# Patient Record
Sex: Female | Born: 1945 | Race: White | Hispanic: No | Marital: Married | Smoking: Never smoker
Health system: Southern US, Community
[De-identification: ages and names within clinical notes are randomized; demographics above are authoritative.]

## PROBLEM LIST (undated history)

## (undated) DIAGNOSIS — Z923 Personal history of irradiation: Secondary | ICD-10-CM

## (undated) DIAGNOSIS — T8859XA Other complications of anesthesia, initial encounter: Secondary | ICD-10-CM

## (undated) DIAGNOSIS — H269 Unspecified cataract: Secondary | ICD-10-CM

## (undated) DIAGNOSIS — T7840XA Allergy, unspecified, initial encounter: Secondary | ICD-10-CM

## (undated) DIAGNOSIS — M810 Age-related osteoporosis without current pathological fracture: Secondary | ICD-10-CM

## (undated) DIAGNOSIS — K219 Gastro-esophageal reflux disease without esophagitis: Secondary | ICD-10-CM

## (undated) DIAGNOSIS — M1990 Unspecified osteoarthritis, unspecified site: Secondary | ICD-10-CM

## (undated) DIAGNOSIS — C50919 Malignant neoplasm of unspecified site of unspecified female breast: Secondary | ICD-10-CM

## (undated) HISTORY — DX: Unspecified osteoarthritis, unspecified site: M19.90

## (undated) HISTORY — DX: Gastro-esophageal reflux disease without esophagitis: K21.9

## (undated) HISTORY — DX: Allergy, unspecified, initial encounter: T78.40XA

## (undated) HISTORY — PX: CATARACT EXTRACTION W/ INTRAOCULAR LENS  IMPLANT, BILATERAL: SHX1307

## (undated) HISTORY — PX: TUBAL LIGATION: SHX77

## (undated) HISTORY — DX: Unspecified cataract: H26.9

## (undated) HISTORY — DX: Age-related osteoporosis without current pathological fracture: M81.0

## (undated) HISTORY — PX: COLONOSCOPY: SHX174

---

## 2004-10-13 ENCOUNTER — Ambulatory Visit: Payer: Self-pay | Admitting: Internal Medicine

## 2005-08-22 ENCOUNTER — Ambulatory Visit: Payer: Self-pay | Admitting: Internal Medicine

## 2006-08-31 ENCOUNTER — Ambulatory Visit: Payer: Self-pay | Admitting: Internal Medicine

## 2006-10-04 ENCOUNTER — Ambulatory Visit (HOSPITAL_COMMUNITY): Admission: RE | Admit: 2006-10-04 | Discharge: 2006-10-04 | Payer: Self-pay | Admitting: *Deleted

## 2007-10-31 ENCOUNTER — Ambulatory Visit: Payer: Self-pay | Admitting: Internal Medicine

## 2008-11-05 ENCOUNTER — Ambulatory Visit: Payer: Self-pay | Admitting: Internal Medicine

## 2008-11-18 ENCOUNTER — Ambulatory Visit: Payer: Self-pay | Admitting: Internal Medicine

## 2009-11-23 ENCOUNTER — Ambulatory Visit: Payer: Self-pay | Admitting: Internal Medicine

## 2010-01-13 ENCOUNTER — Ambulatory Visit: Payer: Self-pay | Admitting: Internal Medicine

## 2011-01-12 ENCOUNTER — Ambulatory Visit: Payer: Self-pay | Admitting: Internal Medicine

## 2012-01-18 ENCOUNTER — Ambulatory Visit: Payer: Self-pay | Admitting: Internal Medicine

## 2013-02-06 ENCOUNTER — Ambulatory Visit: Payer: Self-pay | Admitting: Internal Medicine

## 2013-02-12 ENCOUNTER — Ambulatory Visit: Payer: Self-pay | Admitting: Internal Medicine

## 2014-02-12 ENCOUNTER — Ambulatory Visit: Payer: Self-pay | Admitting: Internal Medicine

## 2015-02-16 ENCOUNTER — Ambulatory Visit: Payer: Self-pay | Admitting: Internal Medicine

## 2015-02-16 DIAGNOSIS — Z13220 Encounter for screening for lipoid disorders: Secondary | ICD-10-CM | POA: Diagnosis not present

## 2015-02-16 DIAGNOSIS — Z1389 Encounter for screening for other disorder: Secondary | ICD-10-CM | POA: Diagnosis not present

## 2015-02-16 DIAGNOSIS — R7989 Other specified abnormal findings of blood chemistry: Secondary | ICD-10-CM | POA: Diagnosis not present

## 2015-02-16 DIAGNOSIS — Z79899 Other long term (current) drug therapy: Secondary | ICD-10-CM | POA: Diagnosis not present

## 2015-02-16 DIAGNOSIS — Z1231 Encounter for screening mammogram for malignant neoplasm of breast: Secondary | ICD-10-CM | POA: Diagnosis not present

## 2015-02-23 DIAGNOSIS — J309 Allergic rhinitis, unspecified: Secondary | ICD-10-CM | POA: Diagnosis not present

## 2015-02-23 DIAGNOSIS — Z0000 Encounter for general adult medical examination without abnormal findings: Secondary | ICD-10-CM | POA: Diagnosis not present

## 2015-02-23 DIAGNOSIS — H9203 Otalgia, bilateral: Secondary | ICD-10-CM | POA: Diagnosis not present

## 2015-02-23 DIAGNOSIS — G8929 Other chronic pain: Secondary | ICD-10-CM | POA: Diagnosis not present

## 2015-06-30 DIAGNOSIS — M25511 Pain in right shoulder: Secondary | ICD-10-CM | POA: Diagnosis not present

## 2015-06-30 DIAGNOSIS — M19011 Primary osteoarthritis, right shoulder: Secondary | ICD-10-CM | POA: Diagnosis not present

## 2015-08-25 DIAGNOSIS — L989 Disorder of the skin and subcutaneous tissue, unspecified: Secondary | ICD-10-CM | POA: Diagnosis not present

## 2015-08-25 DIAGNOSIS — I10 Essential (primary) hypertension: Secondary | ICD-10-CM | POA: Diagnosis not present

## 2015-08-25 DIAGNOSIS — R7989 Other specified abnormal findings of blood chemistry: Secondary | ICD-10-CM | POA: Diagnosis not present

## 2015-08-25 DIAGNOSIS — Z79899 Other long term (current) drug therapy: Secondary | ICD-10-CM | POA: Diagnosis not present

## 2015-08-25 DIAGNOSIS — E7800 Pure hypercholesterolemia, unspecified: Secondary | ICD-10-CM | POA: Diagnosis not present

## 2015-08-25 DIAGNOSIS — M25511 Pain in right shoulder: Secondary | ICD-10-CM | POA: Diagnosis not present

## 2015-09-10 DIAGNOSIS — M7581 Other shoulder lesions, right shoulder: Secondary | ICD-10-CM | POA: Diagnosis not present

## 2015-09-22 DIAGNOSIS — M7581 Other shoulder lesions, right shoulder: Secondary | ICD-10-CM | POA: Diagnosis not present

## 2015-09-22 DIAGNOSIS — M25511 Pain in right shoulder: Secondary | ICD-10-CM | POA: Diagnosis not present

## 2015-09-25 DIAGNOSIS — M7581 Other shoulder lesions, right shoulder: Secondary | ICD-10-CM | POA: Diagnosis not present

## 2015-09-25 DIAGNOSIS — M25511 Pain in right shoulder: Secondary | ICD-10-CM | POA: Diagnosis not present

## 2015-09-28 DIAGNOSIS — M25511 Pain in right shoulder: Secondary | ICD-10-CM | POA: Diagnosis not present

## 2015-09-28 DIAGNOSIS — M7581 Other shoulder lesions, right shoulder: Secondary | ICD-10-CM | POA: Diagnosis not present

## 2015-10-06 DIAGNOSIS — M25511 Pain in right shoulder: Secondary | ICD-10-CM | POA: Diagnosis not present

## 2015-10-06 DIAGNOSIS — M7581 Other shoulder lesions, right shoulder: Secondary | ICD-10-CM | POA: Diagnosis not present

## 2015-10-09 DIAGNOSIS — M25511 Pain in right shoulder: Secondary | ICD-10-CM | POA: Diagnosis not present

## 2015-10-09 DIAGNOSIS — M7581 Other shoulder lesions, right shoulder: Secondary | ICD-10-CM | POA: Diagnosis not present

## 2015-10-12 DIAGNOSIS — M25511 Pain in right shoulder: Secondary | ICD-10-CM | POA: Diagnosis not present

## 2015-10-12 DIAGNOSIS — M7581 Other shoulder lesions, right shoulder: Secondary | ICD-10-CM | POA: Diagnosis not present

## 2015-10-20 DIAGNOSIS — M7581 Other shoulder lesions, right shoulder: Secondary | ICD-10-CM | POA: Diagnosis not present

## 2015-10-20 DIAGNOSIS — M25511 Pain in right shoulder: Secondary | ICD-10-CM | POA: Diagnosis not present

## 2015-10-22 DIAGNOSIS — M7581 Other shoulder lesions, right shoulder: Secondary | ICD-10-CM | POA: Diagnosis not present

## 2015-10-22 DIAGNOSIS — M25511 Pain in right shoulder: Secondary | ICD-10-CM | POA: Diagnosis not present

## 2015-10-26 DIAGNOSIS — L578 Other skin changes due to chronic exposure to nonionizing radiation: Secondary | ICD-10-CM | POA: Diagnosis not present

## 2015-10-26 DIAGNOSIS — L821 Other seborrheic keratosis: Secondary | ICD-10-CM | POA: Diagnosis not present

## 2015-10-26 DIAGNOSIS — L820 Inflamed seborrheic keratosis: Secondary | ICD-10-CM | POA: Diagnosis not present

## 2016-02-23 ENCOUNTER — Other Ambulatory Visit: Payer: Self-pay | Admitting: Internal Medicine

## 2016-02-23 DIAGNOSIS — Z1231 Encounter for screening mammogram for malignant neoplasm of breast: Secondary | ICD-10-CM

## 2016-02-24 ENCOUNTER — Other Ambulatory Visit: Payer: Self-pay | Admitting: Internal Medicine

## 2016-02-24 ENCOUNTER — Ambulatory Visit
Admission: RE | Admit: 2016-02-24 | Discharge: 2016-02-24 | Disposition: A | Discharge status: Home / Self Care | Payer: Commercial Managed Care - HMO | Source: Ambulatory Visit | Attending: Internal Medicine | Admitting: Internal Medicine

## 2016-02-24 DIAGNOSIS — Z13220 Encounter for screening for lipoid disorders: Secondary | ICD-10-CM | POA: Diagnosis not present

## 2016-02-24 DIAGNOSIS — Z1211 Encounter for screening for malignant neoplasm of colon: Secondary | ICD-10-CM | POA: Diagnosis not present

## 2016-02-24 DIAGNOSIS — Z0000 Encounter for general adult medical examination without abnormal findings: Secondary | ICD-10-CM | POA: Diagnosis not present

## 2016-02-24 DIAGNOSIS — M8580 Other specified disorders of bone density and structure, unspecified site: Secondary | ICD-10-CM | POA: Diagnosis not present

## 2016-02-24 DIAGNOSIS — Z1231 Encounter for screening mammogram for malignant neoplasm of breast: Secondary | ICD-10-CM | POA: Insufficient documentation

## 2016-02-24 DIAGNOSIS — Z79899 Other long term (current) drug therapy: Secondary | ICD-10-CM | POA: Diagnosis not present

## 2016-02-24 DIAGNOSIS — I10 Essential (primary) hypertension: Secondary | ICD-10-CM | POA: Diagnosis not present

## 2016-02-24 DIAGNOSIS — R7989 Other specified abnormal findings of blood chemistry: Secondary | ICD-10-CM | POA: Diagnosis not present

## 2016-02-24 DIAGNOSIS — J301 Allergic rhinitis due to pollen: Secondary | ICD-10-CM | POA: Diagnosis not present

## 2016-03-03 DIAGNOSIS — Z1211 Encounter for screening for malignant neoplasm of colon: Secondary | ICD-10-CM | POA: Diagnosis not present

## 2016-03-14 DIAGNOSIS — M8580 Other specified disorders of bone density and structure, unspecified site: Secondary | ICD-10-CM | POA: Diagnosis not present

## 2016-07-05 ENCOUNTER — Encounter: Payer: Self-pay | Admitting: Internal Medicine

## 2016-09-01 DIAGNOSIS — R946 Abnormal results of thyroid function studies: Secondary | ICD-10-CM | POA: Diagnosis not present

## 2016-09-01 DIAGNOSIS — I10 Essential (primary) hypertension: Secondary | ICD-10-CM | POA: Diagnosis not present

## 2016-09-01 DIAGNOSIS — J301 Allergic rhinitis due to pollen: Secondary | ICD-10-CM | POA: Diagnosis not present

## 2016-09-01 DIAGNOSIS — E7800 Pure hypercholesterolemia, unspecified: Secondary | ICD-10-CM | POA: Diagnosis not present

## 2016-09-01 DIAGNOSIS — Z79899 Other long term (current) drug therapy: Secondary | ICD-10-CM | POA: Diagnosis not present

## 2017-02-06 DIAGNOSIS — H524 Presbyopia: Secondary | ICD-10-CM | POA: Diagnosis not present

## 2017-02-06 DIAGNOSIS — Z0100 Encounter for examination of eyes and vision without abnormal findings: Secondary | ICD-10-CM | POA: Diagnosis not present

## 2017-02-21 DIAGNOSIS — I10 Essential (primary) hypertension: Secondary | ICD-10-CM | POA: Diagnosis not present

## 2017-02-21 DIAGNOSIS — R946 Abnormal results of thyroid function studies: Secondary | ICD-10-CM | POA: Diagnosis not present

## 2017-02-21 DIAGNOSIS — E7800 Pure hypercholesterolemia, unspecified: Secondary | ICD-10-CM | POA: Diagnosis not present

## 2017-02-21 DIAGNOSIS — Z79899 Other long term (current) drug therapy: Secondary | ICD-10-CM | POA: Diagnosis not present

## 2017-02-27 DIAGNOSIS — M19071 Primary osteoarthritis, right ankle and foot: Secondary | ICD-10-CM | POA: Diagnosis not present

## 2017-02-27 DIAGNOSIS — I10 Essential (primary) hypertension: Secondary | ICD-10-CM | POA: Diagnosis not present

## 2017-02-27 DIAGNOSIS — Z1231 Encounter for screening mammogram for malignant neoplasm of breast: Secondary | ICD-10-CM | POA: Diagnosis not present

## 2017-02-27 DIAGNOSIS — Z0000 Encounter for general adult medical examination without abnormal findings: Secondary | ICD-10-CM | POA: Diagnosis not present

## 2017-02-27 DIAGNOSIS — M79671 Pain in right foot: Secondary | ICD-10-CM | POA: Diagnosis not present

## 2017-03-06 ENCOUNTER — Other Ambulatory Visit: Payer: Self-pay | Admitting: Internal Medicine

## 2017-03-06 DIAGNOSIS — Z1231 Encounter for screening mammogram for malignant neoplasm of breast: Secondary | ICD-10-CM

## 2017-03-28 ENCOUNTER — Ambulatory Visit
Admission: RE | Admit: 2017-03-28 | Discharge: 2017-03-28 | Disposition: A | Discharge status: Home / Self Care | Payer: Commercial Managed Care - HMO | Source: Ambulatory Visit | Attending: Internal Medicine | Admitting: Internal Medicine

## 2017-03-28 ENCOUNTER — Encounter: Payer: Self-pay | Admitting: Radiology

## 2017-03-28 DIAGNOSIS — Z1231 Encounter for screening mammogram for malignant neoplasm of breast: Secondary | ICD-10-CM | POA: Diagnosis not present

## 2017-08-29 DIAGNOSIS — J301 Allergic rhinitis due to pollen: Secondary | ICD-10-CM | POA: Diagnosis not present

## 2017-08-29 DIAGNOSIS — E7800 Pure hypercholesterolemia, unspecified: Secondary | ICD-10-CM | POA: Diagnosis not present

## 2017-08-29 DIAGNOSIS — Z1211 Encounter for screening for malignant neoplasm of colon: Secondary | ICD-10-CM | POA: Diagnosis not present

## 2017-08-29 DIAGNOSIS — I10 Essential (primary) hypertension: Secondary | ICD-10-CM | POA: Diagnosis not present

## 2017-08-29 DIAGNOSIS — Z79899 Other long term (current) drug therapy: Secondary | ICD-10-CM | POA: Diagnosis not present

## 2017-08-29 DIAGNOSIS — J329 Chronic sinusitis, unspecified: Secondary | ICD-10-CM | POA: Diagnosis not present

## 2017-09-06 DIAGNOSIS — Z1211 Encounter for screening for malignant neoplasm of colon: Secondary | ICD-10-CM | POA: Diagnosis not present

## 2017-10-02 ENCOUNTER — Other Ambulatory Visit: Payer: Self-pay

## 2017-10-02 ENCOUNTER — Emergency Department: Payer: Medicare HMO

## 2017-10-02 ENCOUNTER — Observation Stay
Admission: EM | Admit: 2017-10-02 | Discharge: 2017-10-03 | Disposition: A | Discharge status: Home / Self Care | Payer: Medicare HMO | Attending: Internal Medicine | Admitting: Internal Medicine

## 2017-10-02 DIAGNOSIS — R0789 Other chest pain: Secondary | ICD-10-CM | POA: Diagnosis not present

## 2017-10-02 DIAGNOSIS — Y9389 Activity, other specified: Secondary | ICD-10-CM | POA: Diagnosis not present

## 2017-10-02 DIAGNOSIS — Y9289 Other specified places as the place of occurrence of the external cause: Secondary | ICD-10-CM | POA: Insufficient documentation

## 2017-10-02 DIAGNOSIS — Z79899 Other long term (current) drug therapy: Secondary | ICD-10-CM | POA: Diagnosis not present

## 2017-10-02 DIAGNOSIS — T50905A Adverse effect of unspecified drugs, medicaments and biological substances, initial encounter: Secondary | ICD-10-CM | POA: Diagnosis not present

## 2017-10-02 DIAGNOSIS — I10 Essential (primary) hypertension: Secondary | ICD-10-CM | POA: Insufficient documentation

## 2017-10-02 DIAGNOSIS — R079 Chest pain, unspecified: Secondary | ICD-10-CM

## 2017-10-02 DIAGNOSIS — X58XXXA Exposure to other specified factors, initial encounter: Secondary | ICD-10-CM | POA: Insufficient documentation

## 2017-10-02 DIAGNOSIS — Z8249 Family history of ischemic heart disease and other diseases of the circulatory system: Secondary | ICD-10-CM | POA: Insufficient documentation

## 2017-10-02 LAB — CBC
HEMATOCRIT: 42.4 % (ref 35.0–47.0)
Hemoglobin: 14.3 g/dL (ref 12.0–16.0)
MCH: 28.9 pg (ref 26.0–34.0)
MCHC: 33.7 g/dL (ref 32.0–36.0)
MCV: 85.7 fL (ref 80.0–100.0)
PLATELETS: 155 10*3/uL (ref 150–440)
RBC: 4.95 MIL/uL (ref 3.80–5.20)
RDW: 13.5 % (ref 11.5–14.5)
WBC: 4.7 10*3/uL (ref 3.6–11.0)

## 2017-10-02 LAB — BASIC METABOLIC PANEL
Anion gap: 10 (ref 5–15)
BUN: 13 mg/dL (ref 6–20)
CHLORIDE: 106 mmol/L (ref 101–111)
CO2: 25 mmol/L (ref 22–32)
CREATININE: 0.7 mg/dL (ref 0.44–1.00)
Calcium: 9.7 mg/dL (ref 8.9–10.3)
GFR calc non Af Amer: >60 mL/min (ref >60)
Glucose, Bld: 109 mg/dL — ABNORMAL HIGH (ref 65–99)
POTASSIUM: 4.1 mmol/L (ref 3.5–5.1)
SODIUM: 141 mmol/L (ref 135–145)

## 2017-10-02 LAB — TROPONIN I
TROPONIN I: 0.08 ng/mL — AB (ref <0.03)
Troponin I: 0.03 ng/mL (ref <0.03)
Troponin I: 0.04 ng/mL (ref <0.03)

## 2017-10-02 MED ORDER — ACETAMINOPHEN 325 MG PO TABS
650.0000 mg | ORAL_TABLET | Freq: Four times a day (QID) | ORAL | Status: DC | PRN
  Administered 2017-10-02 – 2017-10-03 (×3): 650 mg via ORAL
  Filled 2017-10-02 (×3): qty 2

## 2017-10-02 MED ORDER — ONDANSETRON HCL 4 MG/2ML IJ SOLN: 4.0000 mg | Freq: Four times a day (QID) | INTRAMUSCULAR | Status: DC | PRN

## 2017-10-02 MED ORDER — BISACODYL 5 MG PO TBEC: 5.0000 mg | DELAYED_RELEASE_TABLET | Freq: Every day | ORAL | Status: DC | PRN

## 2017-10-02 MED ORDER — ACETAMINOPHEN 650 MG RE SUPP: 650.0000 mg | Freq: Four times a day (QID) | RECTAL | Status: DC | PRN

## 2017-10-02 MED ORDER — DOCUSATE SODIUM 100 MG PO CAPS
100.0000 mg | ORAL_CAPSULE | Freq: Two times a day (BID) | ORAL | Status: DC
  Administered 2017-10-03: 100 mg via ORAL
  Filled 2017-10-02: qty 1

## 2017-10-02 MED ORDER — METOPROLOL TARTRATE 25 MG PO TABS
25.0000 mg | ORAL_TABLET | Freq: Two times a day (BID) | ORAL | Status: DC
  Administered 2017-10-02: 25 mg via ORAL
  Filled 2017-10-02 (×3): qty 1

## 2017-10-02 MED ORDER — ENOXAPARIN SODIUM 40 MG/0.4ML ~~LOC~~ SOLN: 40.0000 mg | SUBCUTANEOUS | Status: DC

## 2017-10-02 MED ORDER — TRAZODONE HCL 50 MG PO TABS: 25.0000 mg | ORAL_TABLET | Freq: Every evening | ORAL | Status: DC | PRN

## 2017-10-02 MED ORDER — SODIUM CHLORIDE 0.9% FLUSH
3.0000 mL | Freq: Two times a day (BID) | INTRAVENOUS | Status: DC
  Administered 2017-10-02 – 2017-10-03 (×2): 3 mL via INTRAVENOUS

## 2017-10-02 MED ORDER — ONDANSETRON HCL 4 MG PO TABS: 4.0000 mg | ORAL_TABLET | Freq: Four times a day (QID) | ORAL | Status: DC | PRN

## 2017-10-02 MED ORDER — ASPIRIN EC 81 MG PO TBEC
81.0000 mg | DELAYED_RELEASE_TABLET | Freq: Every day | ORAL | Status: DC
  Administered 2017-10-02 – 2017-10-03 (×2): 81 mg via ORAL
  Filled 2017-10-02 (×2): qty 1

## 2017-10-02 MED ORDER — NITROGLYCERIN 2 % TD OINT
0.5000 [in_us] | TOPICAL_OINTMENT | Freq: Four times a day (QID) | TRANSDERMAL | Status: DC
  Administered 2017-10-02 – 2017-10-03 (×4): 0.5 [in_us] via TOPICAL
  Filled 2017-10-02 (×4): qty 1

## 2017-10-02 NOTE — ED Provider Notes (Signed)
[facility] Emergency Department Provider Note  ____________________________________________   First MD Initiated Contact with Patient 10/02/17 1100     (approximate)  I have reviewed the triage vital signs and the nursing notes.   HISTORY  Chief Complaint Chest Pain   HPI Jean Schwartz is a 71 y.o. female that any chronic medical problems was presented to the emergency department with central chest pain.  She says that she was getting numbed for a procedure at her dentist's office when she began to feel chest pressure to the center left of her chest.  She denies any radiation of the pressure.  Denies any shortness of breath, nausea or vomiting.  However, when EMS arrived they gave her for aspirin as well as 1 nitroglycerin spray which completely resolved the pain.  She reports also one episode last week where she thought that she was breathing heavily through her mouth outside when she also experienced a brief episode of chest pain.  She does have a history of coronary artery disease in her family with her father having heart attack in his 84s.  The patient denies smoking.  Denies taking any medications on a regular basis.    No past medical history on file.  There are no active problems to display for this patient.   No past surgical history on file.  Prior to Admission medications   Not on File    Allergies Patient has no allergy information on record.  Family History  Problem Relation Age of Onset  . Breast cancer Neg Hx     Social History Social History   Tobacco Use  . Smoking status: Not on file  Substance Use Topics  . Alcohol use: Not on file  . Drug use: Not on file    Review of Systems  Constitutional: No fever/chills Eyes: No visual changes. ENT: No sore throat. Cardiovascular: As above Respiratory: Denies shortness of breath. Gastrointestinal: No abdominal pain.  No nausea, no vomiting.  No diarrhea.  No  constipation. Genitourinary: Negative for dysuria. Musculoskeletal: Negative for back pain. Skin: Negative for rash. Neurological: Negative for headaches, focal weakness or numbness.   ____________________________________________   PHYSICAL EXAM:  VITAL SIGNS: ED Triage Vitals  Enc Vitals Group     BP 10/02/17 1047 (!) 189/72     Pulse Rate 10/02/17 1047 61     Resp 10/02/17 1047 14     Temp 10/02/17 1047 97.7 F (36.5 C)     Temp Source 10/02/17 1047 Oral     SpO2 10/02/17 1047 99 %     Weight 10/02/17 1050 128 lb (58.1 kg)     Height 10/02/17 1050 5\' 2"  (1.575 m)     Head Circumference --      Peak Flow --      Pain Score 10/02/17 1046 5     Pain Loc --      Pain Edu? --      Excl. in [place]? --     Constitutional: Alert and oriented. Well appearing and in no acute distress. Eyes: Conjunctivae are normal.  Head: Atraumatic. Nose: No congestion/rhinnorhea. Mouth/Throat: Mucous membranes are moist.  Neck: No stridor.   Cardiovascular: Normal rate, regular rhythm. Grossly normal heart sounds.   Respiratory: Normal respiratory effort.  No retractions. Lungs CTAB. Gastrointestinal: Soft and nontender. No distention.  Musculoskeletal: No lower extremity tenderness nor edema.  No joint effusions. Neurologic:  Normal speech and language. No gross focal neurologic deficits are appreciated. Skin:  Skin is warm, dry and intact. No rash noted. Psychiatric: Mood and affect are normal. Speech and behavior are normal.  ____________________________________________   LABS (all labs ordered are listed, but only abnormal results are displayed)  Labs Reviewed  BASIC METABOLIC PANEL  CBC  TROPONIN I   ____________________________________________  EKG  ED ECG REPORT I, Schaevitz,  Youlanda Roys, the attending physician, personally viewed and interpreted this ECG.   Date: 10/02/2017  EKG Time: 1056  Rate: 59  Rhythm: normal sinus rhythm  Axis: Normal  Intervals:none  ST&T  Change: No ST segment elevation or depression.  No abnormal T wave inversion.  Reviewed the prehospital EKGs.  There was a concern for atrial fibrillation in route.  However, this appears to be the read of the EKG machine in the ambulance.  There are visible P waves on the tracings. ____________________________________________  RADIOLOGY  No active disease on the chest x-ray ____________________________________________   PROCEDURES  Procedure(s) performed:  Procedures  Critical Care performed:   ____________________________________________   INITIAL IMPRESSION / ASSESSMENT AND PLAN / ED COURSE  Pertinent labs & imaging results that were available during my care of the patient were reviewed by me and considered in my medical decision making (see chart for details).  Differential diagnosis includes, but is not limited to, ACS, aortic dissection, pulmonary embolism, cardiac tamponade, pneumothorax, pneumonia, pericarditis, myocarditis, GI-related causes including esophagitis/gastritis, and musculoskeletal chest wall pain.    As part of my medical decision making, I reviewed the following data within the [facility] Notes from prior visit records  ----------------------------------------- 1:01 PM on 10/02/2017 -----------------------------------------  Patient at this time continues to be chest pain-free.  Slightly elevated troponin of 0.03.  Patient will be admitted to the hospital.  Concerning story with chest pressure relieved with nitroglycerin.  Family history positive for CAD.  Patient and husband are understanding of the plan willing to comply.  Discussed the case with the hospitalist, Dr. Vella Kohler, who would like to hold on heparin for now until the second troponin results.  Patient is also received 324 mg of aspirin prior to arrival.      ____________________________________________   FINAL CLINICAL IMPRESSION(S) / ED DIAGNOSES  Chest Pain    NEW  MEDICATIONS STARTED DURING THIS VISIT:  This SmartLink is deprecated. Use AVSMEDLIST instead to display the medication list for a patient.   Note:  This document was prepared using Dragon voice recognition software and may include unintentional dictation errors.     Orbie Pyo, MD 10/02/17 1302

## 2017-10-02 NOTE — ED Triage Notes (Signed)
Pt arrived via EMS from dentist office d/t chest pain described as "heaviness". Pt was given 4 baby Asprin and 1 nitro spray in route to [facility] with relief. Ems states pt was in A Fib but is now NSR. Pt denies any chest pain now, just a headache. Pt has no cardiac hx.

## 2017-10-02 NOTE — Progress Notes (Signed)
Notified Dr. Vianne Bulls via Shea Evans of pt. Troponin of 0.08, awaiting cardiac consult information. Pt received Nitro in E.D., ASA, and metoprolol this afternoon as well. She has no complaints at this time.

## 2017-10-02 NOTE — ED Notes (Addendum)
Dr. Clearnce Hasten made aware of critical troponin lab value of 0.03

## 2017-10-03 ENCOUNTER — Encounter: Payer: Medicare HMO | Attending: Internal Medicine

## 2017-10-03 ENCOUNTER — Encounter: Payer: Self-pay | Admitting: Radiology

## 2017-10-03 DIAGNOSIS — R079 Chest pain, unspecified: Secondary | ICD-10-CM | POA: Diagnosis not present

## 2017-10-03 DIAGNOSIS — R030 Elevated blood-pressure reading, without diagnosis of hypertension: Secondary | ICD-10-CM | POA: Diagnosis not present

## 2017-10-03 DIAGNOSIS — R748 Abnormal levels of other serum enzymes: Secondary | ICD-10-CM | POA: Diagnosis not present

## 2017-10-03 LAB — LIPID PANEL
Cholesterol: 169 mg/dL (ref 0–200)
HDL: 37 mg/dL — ABNORMAL LOW (ref >40)
LDL CALC: 98 mg/dL (ref 0–99)
TRIGLYCERIDES: 172 mg/dL — AB (ref <150)
Total CHOL/HDL Ratio: 4.6 RATIO
VLDL: 34 mg/dL (ref 0–40)

## 2017-10-03 LAB — NM MYOCAR MULTI W/SPECT W/WALL MOTION / EF
CHL CUP RESTING HR STRESS: 62 {beats}/min
CSEPHR: 67 %
CSEPPHR: 99 {beats}/min
LV dias vol: 63 mL (ref 46–106)
LV sys vol: 29 mL
TID: 0.95

## 2017-10-03 LAB — CBC
HEMATOCRIT: 39 % (ref 35.0–47.0)
HEMOGLOBIN: 13 g/dL (ref 12.0–16.0)
MCH: 28.5 pg (ref 26.0–34.0)
MCHC: 33.3 g/dL (ref 32.0–36.0)
MCV: 85.7 fL (ref 80.0–100.0)
Platelets: 173 10*3/uL (ref 150–440)
RBC: 4.55 MIL/uL (ref 3.80–5.20)
RDW: 13.4 % (ref 11.5–14.5)
WBC: 4.5 10*3/uL (ref 3.6–11.0)

## 2017-10-03 LAB — BASIC METABOLIC PANEL
ANION GAP: 7 (ref 5–15)
BUN: 16 mg/dL (ref 6–20)
CO2: 26 mmol/L (ref 22–32)
Calcium: 8.9 mg/dL (ref 8.9–10.3)
Chloride: 105 mmol/L (ref 101–111)
Creatinine, Ser: 0.86 mg/dL (ref 0.44–1.00)
GFR calc Af Amer: >60 mL/min (ref >60)
GLUCOSE: 106 mg/dL — AB (ref 65–99)
POTASSIUM: 3.9 mmol/L (ref 3.5–5.1)
Sodium: 138 mmol/L (ref 135–145)

## 2017-10-03 LAB — GLUCOSE, CAPILLARY: Glucose-Capillary: 109 mg/dL — ABNORMAL HIGH (ref 65–99)

## 2017-10-03 LAB — TROPONIN I: TROPONIN I: 0.03 ng/mL — AB (ref <0.03)

## 2017-10-03 MED ORDER — REGADENOSON 0.4 MG/5ML IV SOLN
0.4000 mg | Freq: Once | INTRAVENOUS | Status: AC
  Administered 2017-10-03: 0.4 mg via INTRAVENOUS

## 2017-10-03 MED ORDER — TECHNETIUM TC 99M TETROFOSMIN IV KIT
10.0000 | PACK | Freq: Once | INTRAVENOUS | Status: AC | PRN
  Administered 2017-10-03: 12.85 via INTRAVENOUS

## 2017-10-03 MED ORDER — TECHNETIUM TC 99M TETROFOSMIN IV KIT
32.3300 | PACK | Freq: Once | INTRAVENOUS | Status: AC | PRN
  Administered 2017-10-03: 32.33 via INTRAVENOUS

## 2017-10-03 NOTE — Progress Notes (Signed)
Patient going down for stress test at this time.

## 2017-10-03 NOTE — Progress Notes (Signed)
Patient stable for discharge.   PIV discontinued, catheter intact; site clean, dry, intact. Discharge instructions and follow-up reviewed. Patient verbalized understanding.   Family at bedside to take patient home.    

## 2017-10-03 NOTE — Discharge Summary (Signed)
[place] [facility] [place] NAME: Jean Schwartz    MR#:  [id number]  DATE OF BIRTH:  09-Dec-1945  DATE OF ADMISSION:  10/02/2017 ADMITTING PHYSICIAN: Epifanio Lesches, MD  DATE OF DISCHARGE: 10/03/2017  4:07 PM  PRIMARY CARE PHYSICIAN: Idelle Crouch, MD    ADMISSION DIAGNOSIS:  Chest pain, unspecified type [R07.9]  DISCHARGE DIAGNOSIS:  Active Problems:   Chest heaviness   SECONDARY DIAGNOSIS:  History reviewed. No pertinent past medical history.  HOSPITAL COURSE:   1.  Chest pain and hypertension after dental injection of numbing medication.  Patient had borderline troponin but trended to normal range.  Stress test was a low risk scan.  Blood pressure normalized.  I believe this is all an allergic reaction to the dental injection of numbing medication.  DISCHARGE CONDITIONS:   Satisfactory  CONSULTS OBTAINED:  None  DRUG ALLERGIES:  Not on File  DISCHARGE MEDICATIONS:   Discharge Medication List as of 10/03/2017  3:06 PM    CONTINUE these medications which have NOT CHANGED   Details  BIOTIN MAXIMUM PO Take 1 tablet daily by mouth., Historical Med    Calcium Carb-Cholecalciferol (CALCIUM 600 + D) 600-200 MG-UNIT TABS Take by mouth., Historical Med    cholecalciferol (VITAMIN D) 1000 units tablet Take 1,000 Units daily by mouth., Historical Med    Multiple Vitamin (MULTIVITAMIN) tablet Take 1 tablet daily by mouth., Historical Med    Omega-3 Fatty Acids (FISH OIL OMEGA-3 PO) Take 1 capsule as needed by mouth., Historical Med         DISCHARGE INSTRUCTIONS:   Follow-up PMD 1 week  If you experience worsening of your admission symptoms, develop shortness of breath, life threatening emergency, suicidal or homicidal thoughts you must seek medical attention immediately by calling 911 or calling your MD immediately  if symptoms less severe.  You Must read complete instructions/literature along with all the possible  adverse reactions/side effects for all the Medicines you take and that have been prescribed to you. Take any new Medicines after you have completely understood and accept all the possible adverse reactions/side effects.   Please note  You were cared for by a hospitalist during your hospital stay. If you have any questions about your discharge medications or the care you received while you were in the hospital after you are discharged, you can call the unit and asked to speak with the hospitalist on call if the hospitalist that took care of you is not available. Once you are discharged, your primary care physician will handle any further medical issues. Please note that NO REFILLS for any discharge medications will be authorized once you are discharged, as it is imperative that you return to your primary care physician (or establish a relationship with a primary care physician if you do not have one) for your aftercare needs so that they can reassess your need for medications and monitor your lab values.    Today   CHIEF COMPLAINT:   Chief Complaint  Patient presents with  . Chest Pain    HISTORY OF PRESENT ILLNESS:  Jean Schwartz  is a 71 y.o. female  came in with chest pain after injection by the dentist   VITAL SIGNS:  Blood pressure (!) 145/62, pulse 72, temperature 98.1 F (36.7 C), temperature source Oral, resp. rate 16, height 5\' 2"  (1.575 m), weight 57.9 kg (127 lb 9.6 oz), SpO2 96 %.    PHYSICAL EXAMINATION:  GENERAL:  71 y.o.-year-old  patient lying in the bed with no acute distress.  EYES: Pupils equal, round, reactive to light and accommodation. No scleral icterus. Extraocular muscles intact.  HEENT: Head atraumatic, normocephalic. Oropharynx and nasopharynx clear.  NECK:  Supple, no jugular venous distention. No thyroid enlargement, no tenderness.  LUNGS: Normal breath sounds bilaterally, no wheezing, rales,rhonchi or crepitation. No use of accessory muscles of respiration.   CARDIOVASCULAR: S1, S2 normal. No murmurs, rubs, or gallops.  ABDOMEN: Soft, non-tender, non-distended. Bowel sounds present. No organomegaly or mass.  EXTREMITIES: No pedal edema, cyanosis, or clubbing.  NEUROLOGIC: Cranial nerves II through XII are intact. Muscle strength 5/5 in all extremities. Sensation intact. Gait not checked.  PSYCHIATRIC: The patient is alert and oriented x 3.  SKIN: No obvious rash, lesion, or ulcer.   DATA REVIEW:   CBC Recent Labs  Lab 10/03/17 0021  WBC 4.5  HGB 13.0  HCT 39.0  PLT 173    Chemistries  Recent Labs  Lab 10/03/17 0021  NA 138  K 3.9  CL 105  CO2 26  GLUCOSE 106*  BUN 16  CREATININE 0.86  CALCIUM 8.9    Cardiac Enzymes Recent Labs  Lab 10/03/17 0021  TROPONINI 0.03*      RADIOLOGY:  Dg Chest 1 View  Result Date: 10/02/2017 CLINICAL DATA:  Chest pain EXAM: CHEST 1 VIEW COMPARISON:  None. FINDINGS: Heart and mediastinal contours are within normal limits. No focal opacities or effusions. No acute bony abnormality. IMPRESSION: No active disease. Electronically Signed   By: Rolm Baptise M.D.   On: 10/02/2017 12:15   Nm Myocar Multi W/spect W/wall Motion / Ef  Result Date: 10/03/2017  Normal pharmacologic myocardial perfusion stress test without evidence of ischemia or scar.  The left ventricular ejection fraction is hyperdynamic (>65%).  Low risk study.       Management plans discussed with the patient, family and they are in agreement.  CODE STATUS:     Code Status Orders  (From admission, onward)        Start     Ordered   10/02/17 1230  Full code  Continuous     10/02/17 1231    Code Status History    Date Active Date Inactive Code Status Order ID Comments User Context   This patient has a current code status but no historical code status.      TOTAL TIME TAKING CARE OF THIS PATIENT: 32 minutes.    Loletha Grayer M.D on 10/03/2017 at 5:24 PM  Between 7am to 6pm - Pager -  [phone]  After 6pm go to [email or web address] - password [email or web address] [facility]  [phone]  CC: Primary care physician; Idelle Crouch, MD

## 2017-10-03 NOTE — Care Management Obs Status (Signed)
[facility] NOTIFICATION   Patient Details  Name: Lucinda Spells MRN: [id number] Date of Birth: 22-Mar-1946   Medicare Observation Status Notification Given:  Yes    Katrina Stack, RN 10/03/2017, 2:10 PM

## 2017-10-10 DIAGNOSIS — R079 Chest pain, unspecified: Secondary | ICD-10-CM | POA: Diagnosis not present

## 2017-10-10 DIAGNOSIS — I10 Essential (primary) hypertension: Secondary | ICD-10-CM | POA: Diagnosis not present

## 2017-10-25 NOTE — H&P (Signed)
[place] [facility] [place] NAME: Jean Schwartz    MR#:  [id number]  DATE OF BIRTH:  Jul 19, 1946  DATE OF ADMISSION:  10/02/2017  PRIMARY CARE PHYSICIAN: Idelle Crouch, MD   REQUESTING/REFERRING PHYSICIAN: DR.Shaevitz  CHIEF COMPLAINT:Chest pressure   Chief Complaint  Patient presents with  . Chest Pain    HISTORY OF PRESENT ILLNESS:  Jean Schwartz  is a 71 y.o. female with no known medical problems comes with chest pressure in the center of chest and left side when she was at dentist office and getting numbing for dental procedure.pt did not have any SOB,sweatiing.EMS gave ASA and nitro spray which helped with chest pain.she is chest pain free when she came to ER. She does have a history of coronary artery disease in her family with her father having heart attack in his 21s.  The patient denies smoking.  Denies taking any medications on a regular basis.    PAST MEDICAL HISTORY:  History reviewed. No pertinent past medical history.  PAST SURGICAL HISTOIRY:  History reviewed. No pertinent surgical history.  SOCIAL HISTORY:   Social History   Tobacco Use  . Smoking status: Never Smoker  . Smokeless tobacco: Never Used  Substance Use Topics  . Alcohol use: No    Frequency: Never    FAMILY HISTORY:   Family History  Problem Relation Age of Onset  . Breast cancer Neg Hx     DRUG ALLERGIES:  Not on File  REVIEW OF SYSTEMS:  CONSTITUTIONAL: No fever, fatigue or weakness.  EYES: No blurred or double vision.  EARS, NOSE, AND THROAT: No tinnitus or ear pain.  RESPIRATORY: No cough, shortness of breath, wheezing or hemoptysis.  CARDIOVASCULAR: No chest pain, orthopnea, edema.  GASTROINTESTINAL: No nausea, vomiting, diarrhea or abdominal pain.  GENITOURINARY: No dysuria, hematuria.  ENDOCRINE: No polyuria, nocturia,  HEMATOLOGY: No anemia, easy bruising or bleeding SKIN: No rash or lesion. MUSCULOSKELETAL: No joint pain  or arthritis.   NEUROLOGIC: No tingling, numbness, weakness.  PSYCHIATRY: No anxiety or depression.   MEDICATIONS AT HOME:   Prior to Admission medications   Medication Sig Start Date End Date Taking? Authorizing Provider  BIOTIN MAXIMUM PO Take 1 tablet daily by mouth.   Yes [provider]  Calcium Carb-Cholecalciferol (CALCIUM 600 + D) 600-200 MG-UNIT TABS Take by mouth.   Yes [provider]  cholecalciferol (VITAMIN D) 1000 units tablet Take 1,000 Units daily by mouth.   Yes [provider]  Multiple Vitamin (MULTIVITAMIN) tablet Take 1 tablet daily by mouth.   Yes [provider]  Omega-3 Fatty Acids (FISH OIL OMEGA-3 PO) Take 1 capsule as needed by mouth.    [provider]      VITAL SIGNS:  Blood pressure (!) 145/62, pulse 72, temperature 98.1 F (36.7 C), temperature source Oral, resp. rate 16, height 5\' 2"  (1.575 m), weight 57.9 kg (127 lb 9.6 oz), SpO2 96 %.  PHYSICAL EXAMINATION:  GENERAL:  71 y.o.-year-old patient lying in the bed with no acute distress.  EYES: Pupils equal, round, reactive to light and accommodation. No scleral icterus. Extraocular muscles intact.  HEENT: Head atraumatic, normocephalic. Oropharynx and nasopharynx clear.  NECK:  Supple, no jugular venous distention. No thyroid enlargement, no tenderness.  LUNGS: Normal breath sounds bilaterally, no wheezing, rales,rhonchi or crepitation. No use of accessory muscles of respiration.  CARDIOVASCULAR: S1, S2 normal. No murmurs, rubs, or gallops.  ABDOMEN: Soft, nontender, nondistended. Bowel sounds  present. No organomegaly or mass.  EXTREMITIES: No pedal edema, cyanosis, or clubbing.  NEUROLOGIC: Cranial nerves II through XII are intact. Muscle strength 5/5 in all extremities. Sensation intact. Gait not checked.  PSYCHIATRIC: The patient is alert and oriented x 3.  SKIN: No obvious rash, lesion, or ulcer.   LABORATORY PANEL:   CBC No results for input(s): WBC,  HGB, HCT, PLT in the last 168 hours. ------------------------------------------------------------------------------------------------------------------  Chemistries  No results for input(s): NA, K, CL, CO2, GLUCOSE, BUN, CREATININE, CALCIUM, MG, AST, ALT, ALKPHOS, BILITOT in the last 168 hours.  Invalid input(s): GFRCGP ------------------------------------------------------------------------------------------------------------------  Cardiac Enzymes No results for input(s): TROPONINI in the last 168 hours. ------------------------------------------------------------------------------------------------------------------  RADIOLOGY:  No results found.  EKG:   Orders placed or performed during the hospital encounter of 10/02/17  . ED EKG within 10 minutes  . ED EKG within 10 minutes  EKG showed NSR with 59 bpm,no ST T changes.  IMPRESSION AND PLAN:  1.Chest heaviness with slightly elevated troponin;admit to obs status ,cycle troponin,continue ASA,bblocker,nitrates.schedule lexis can stress test am if troponin's stay negative.;    All the records are reviewed and case discussed with ED provider. Management plans discussed with the patient, family and they are in agreement.  CODE STATUS: full  TOTAL TIME TAKING CARE OF THIS PATIENT: 55 minutes.    Epifanio Lesches M.D on 10/02/2017 at 12:23 PM  Between 7am to 6pm - Pager - [phone]  After 6pm go to [email or web address] - password [email or web address] [facility]  Office  [phone]  CC: Primary care physician; Idelle Crouch, MD  Note: This dictation was prepared with Dragon dictation along with smaller phrase technology. Any transcriptional errors that result from this process are unintentional.

## 2018-03-19 DIAGNOSIS — J301 Allergic rhinitis due to pollen: Secondary | ICD-10-CM | POA: Diagnosis not present

## 2018-03-19 DIAGNOSIS — I10 Essential (primary) hypertension: Secondary | ICD-10-CM | POA: Diagnosis not present

## 2018-03-19 DIAGNOSIS — E7800 Pure hypercholesterolemia, unspecified: Secondary | ICD-10-CM | POA: Diagnosis not present

## 2018-03-19 DIAGNOSIS — Z0000 Encounter for general adult medical examination without abnormal findings: Secondary | ICD-10-CM | POA: Diagnosis not present

## 2018-03-19 DIAGNOSIS — Z1231 Encounter for screening mammogram for malignant neoplasm of breast: Secondary | ICD-10-CM | POA: Diagnosis not present

## 2018-03-19 DIAGNOSIS — Z79899 Other long term (current) drug therapy: Secondary | ICD-10-CM | POA: Diagnosis not present

## 2018-04-06 ENCOUNTER — Other Ambulatory Visit: Payer: Self-pay | Admitting: Internal Medicine

## 2018-04-06 DIAGNOSIS — Z1231 Encounter for screening mammogram for malignant neoplasm of breast: Secondary | ICD-10-CM

## 2018-04-25 DIAGNOSIS — M1711 Unilateral primary osteoarthritis, right knee: Secondary | ICD-10-CM | POA: Diagnosis not present

## 2018-04-25 DIAGNOSIS — M25561 Pain in right knee: Secondary | ICD-10-CM | POA: Diagnosis not present

## 2018-05-01 ENCOUNTER — Ambulatory Visit
Admission: RE | Admit: 2018-05-01 | Discharge: 2018-05-01 | Disposition: A | Discharge status: Home / Self Care | Payer: Medicare HMO | Source: Ambulatory Visit | Attending: Internal Medicine | Admitting: Internal Medicine

## 2018-05-01 DIAGNOSIS — Z1231 Encounter for screening mammogram for malignant neoplasm of breast: Secondary | ICD-10-CM

## 2018-06-23 DIAGNOSIS — J0100 Acute maxillary sinusitis, unspecified: Secondary | ICD-10-CM | POA: Diagnosis not present

## 2018-06-23 DIAGNOSIS — H9202 Otalgia, left ear: Secondary | ICD-10-CM | POA: Diagnosis not present

## 2018-09-14 DIAGNOSIS — E7800 Pure hypercholesterolemia, unspecified: Secondary | ICD-10-CM | POA: Diagnosis not present

## 2018-09-14 DIAGNOSIS — J301 Allergic rhinitis due to pollen: Secondary | ICD-10-CM | POA: Diagnosis not present

## 2018-09-14 DIAGNOSIS — Z79899 Other long term (current) drug therapy: Secondary | ICD-10-CM | POA: Diagnosis not present

## 2018-09-17 DIAGNOSIS — E7800 Pure hypercholesterolemia, unspecified: Secondary | ICD-10-CM | POA: Diagnosis not present

## 2018-09-17 DIAGNOSIS — Z79899 Other long term (current) drug therapy: Secondary | ICD-10-CM | POA: Diagnosis not present

## 2018-09-17 DIAGNOSIS — I10 Essential (primary) hypertension: Secondary | ICD-10-CM | POA: Diagnosis not present

## 2018-12-03 ENCOUNTER — Encounter: Payer: Self-pay | Admitting: Internal Medicine

## 2018-12-31 ENCOUNTER — Ambulatory Visit (AMBULATORY_SURGERY_CENTER): Payer: Self-pay

## 2018-12-31 ENCOUNTER — Encounter: Payer: Self-pay | Admitting: Internal Medicine

## 2018-12-31 VITALS — Ht 62.0 in | Wt 127.0 lb

## 2018-12-31 DIAGNOSIS — Z1211 Encounter for screening for malignant neoplasm of colon: Secondary | ICD-10-CM

## 2018-12-31 MED ORDER — NA SULFATE-K SULFATE-MG SULF 17.5-3.13-1.6 GM/177ML PO SOLN: 1.0000 | Freq: Once | ORAL | 0 refills | Status: AC

## 2018-12-31 NOTE — Progress Notes (Signed)
Denies allergies to eggs or soy products. Denies complication of anesthesia or sedation. Denies use of weight loss medication. Denies use of O2.   Emmi instructions declined.  

## 2019-01-14 ENCOUNTER — Encounter: Payer: Self-pay | Admitting: Internal Medicine

## 2019-01-14 ENCOUNTER — Ambulatory Visit (AMBULATORY_SURGERY_CENTER): Payer: Medicare HMO | Admitting: Internal Medicine

## 2019-01-14 VITALS — BP 153/64 | HR 66 | Temp 95.5°F | Resp 12 | Ht 62.0 in | Wt 127.0 lb

## 2019-01-14 DIAGNOSIS — Z1211 Encounter for screening for malignant neoplasm of colon: Secondary | ICD-10-CM | POA: Diagnosis not present

## 2019-01-14 MED ORDER — SODIUM CHLORIDE 0.9 % IV SOLN: 500.0000 mL | Freq: Once | INTRAVENOUS | Status: DC

## 2019-01-14 NOTE — Op Note (Signed)
[place] Patient Name: Jean Schwartz Procedure Date: 01/14/2019 10:35 AM MRN: [id number] Endoscopist: Jerene Bears , MD Age: 73 Referring MD:  Date of Birth: Jun 30, 1946 Gender: Female Account #: [id number] Procedure:                Colonoscopy Indications:              Screening for colorectal malignant neoplasm, Last                            colonoscopy 10 years ago Medicines:                Monitored Anesthesia Care Procedure:                Pre-Anesthesia Assessment:                           - Prior to the procedure, a History and Physical                            was performed, and patient medications and                            allergies were reviewed. The patient's tolerance of                            previous anesthesia was also reviewed. The risks                            and benefits of the procedure and the sedation                            options and risks were discussed with the patient.                            All questions were answered, and informed consent                            was obtained. Prior Anticoagulants: The patient has                            taken no previous anticoagulant or antiplatelet                            agents. ASA Grade Assessment: II - A patient with                            mild systemic disease. After reviewing the risks                            and benefits, the patient was deemed in                            satisfactory condition to undergo the procedure.  After obtaining informed consent, the colonoscope                            was passed under direct vision. Throughout the                            procedure, the patient's blood pressure, pulse, and                            oxygen saturations were monitored continuously. The                            Colonoscope was introduced through the anus and                            advanced to the cecum, identified by  appendiceal                            orifice and ileocecal valve. The colonoscopy was                            performed without difficulty. The patient tolerated                            the procedure well. The quality of the bowel                            preparation was good. The ileocecal valve,                            appendiceal orifice, and rectum were photographed. Scope In: 10:45:00 AM Scope Out: 11:02:08 AM Scope Withdrawal Time: 0 hours 8 minutes 27 seconds  Total Procedure Duration: 0 hours 17 minutes 8 seconds  Findings:                 The perianal and digital rectal examinations were                            normal.                           The entire examined colon appeared normal.                            Retroflexion in the rectum not performed due to                            narrow rectal vault. In forward view the distal                            rectum and anorectal canal appear normal. Complications:            No immediate complications. Estimated Blood Loss:     Estimated blood loss: none. Impression:               -  The entire examined colon is normal.                           - No specimens collected. Recommendation:           - Patient has a contact number available for                            emergencies. The signs and symptoms of potential                            delayed complications were discussed with the                            patient. Return to normal activities tomorrow.                            Written discharge instructions were provided to the                            patient.                           - Resume previous diet.                           - Continue present medications.                           - No repeat colonoscopy due to age and the absence                            of colonic polyps. Jerene Bears, MD 01/14/2019 11:05:09 AM This report has been signed electronically.

## 2019-01-14 NOTE — Patient Instructions (Signed)
YOU HAD AN ENDOSCOPIC PROCEDURE TODAY AT [facility]:   Refer to the procedure report that was given to you for any specific questions about what was found during the examination.  If the procedure report does not answer your questions, please call your gastroenterologist to clarify.  If you requested that your care partner not be given the details of your procedure findings, then the procedure report has been included in a sealed envelope for you to review at your convenience later.  YOU SHOULD EXPECT: Some feelings of bloating in the abdomen. Passage of more gas than usual.  Walking can help get rid of the air that was put into your GI tract during the procedure and reduce the bloating. If you had a lower endoscopy (such as a colonoscopy or flexible sigmoidoscopy) you may notice spotting of blood in your stool or on the toilet paper. If you underwent a bowel prep for your procedure, you may not have a normal bowel movement for a few days.  Please Note:  You might notice some irritation and congestion in your nose or some drainage.  This is from the oxygen used during your procedure.  There is no need for concern and it should clear up in a day or so.  SYMPTOMS TO REPORT IMMEDIATELY:   Following lower endoscopy (colonoscopy or flexible sigmoidoscopy):  Excessive amounts of blood in the stool  Significant tenderness or worsening of abdominal pains  Swelling of the abdomen that is new, acute  Fever of 100F or higher   For urgent or emergent issues, a gastroenterologist can be reached at any hour by calling [phone].   DIET:  We do recommend a small meal at first, but then you may proceed to your regular diet.  Drink plenty of fluids but you should avoid alcoholic beverages for 24 hours.  MEDICATIONS: Continue present medications.  Thank you for allowing [place] to provide for your healthcare needs today.  ACTIVITY:  You should plan to take it easy for the rest of today and  you should NOT DRIVE or use heavy machinery until tomorrow (because of the sedation medicines used during the test).    FOLLOW UP: Our staff will call the number listed on your records the next business day following your procedure to check on you and address any questions or concerns that you may have regarding the information given to you following your procedure. If we do not reach you, we will leave a message.  However, if you are feeling well and you are not experiencing any problems, there is no need to return our call.  We will assume that you have returned to your regular daily activities without incident.  If any biopsies were taken you will be contacted by phone or by letter within the next 1-3 weeks.  Please call us at [phone] if you have not heard about the biopsies in 3 weeks.    SIGNATURES/CONFIDENTIALITY: You and/or your care partner have signed paperwork which will be entered into your electronic medical record.  These signatures attest to the fact that that the information above on your After Visit Summary has been reviewed and is understood.  Full responsibility of the confidentiality of this discharge information lies with you and/or your care-partner.

## 2019-01-15 ENCOUNTER — Telehealth: Payer: Self-pay

## 2019-01-15 NOTE — Telephone Encounter (Signed)
  Follow up Call-  Call back number 01/14/2019  Post procedure Call Back phone  # [phone]  Permission to leave phone message Yes  Some recent data might be hidden     Patient questions:  Do you have a fever, pain , or abdominal swelling? No. Pain Score  0 *  Have you tolerated food without any problems? Yes.    Have you been able to return to your normal activities? Yes.    Do you have any questions about your discharge instructions: Diet   No. Medications  No. Follow up visit  No.  Do you have questions or concerns about your Care? No.  Actions: * If pain score is 4 or above: No action needed, pain <4.

## 2019-05-01 DIAGNOSIS — H2512 Age-related nuclear cataract, left eye: Secondary | ICD-10-CM | POA: Diagnosis not present

## 2019-05-02 DIAGNOSIS — H2511 Age-related nuclear cataract, right eye: Secondary | ICD-10-CM | POA: Diagnosis not present

## 2019-05-15 DIAGNOSIS — H2511 Age-related nuclear cataract, right eye: Secondary | ICD-10-CM | POA: Diagnosis not present

## 2019-05-21 ENCOUNTER — Other Ambulatory Visit: Payer: Self-pay | Admitting: Internal Medicine

## 2019-05-21 DIAGNOSIS — Z1231 Encounter for screening mammogram for malignant neoplasm of breast: Secondary | ICD-10-CM

## 2019-06-25 DIAGNOSIS — R946 Abnormal results of thyroid function studies: Secondary | ICD-10-CM | POA: Diagnosis not present

## 2019-06-28 ENCOUNTER — Other Ambulatory Visit: Payer: Self-pay

## 2019-06-28 ENCOUNTER — Ambulatory Visit
Admission: RE | Admit: 2019-06-28 | Discharge: 2019-06-28 | Disposition: A | Discharge status: Home / Self Care | Payer: Medicare HMO | Source: Ambulatory Visit | Attending: Internal Medicine | Admitting: Internal Medicine

## 2019-06-28 DIAGNOSIS — Z1231 Encounter for screening mammogram for malignant neoplasm of breast: Secondary | ICD-10-CM | POA: Diagnosis not present

## 2019-09-25 ENCOUNTER — Other Ambulatory Visit: Payer: Self-pay | Admitting: Internal Medicine

## 2019-09-25 DIAGNOSIS — R1032 Left lower quadrant pain: Secondary | ICD-10-CM

## 2019-09-25 DIAGNOSIS — R7989 Other specified abnormal findings of blood chemistry: Secondary | ICD-10-CM | POA: Diagnosis not present

## 2019-09-25 DIAGNOSIS — E7800 Pure hypercholesterolemia, unspecified: Secondary | ICD-10-CM | POA: Diagnosis not present

## 2019-09-25 DIAGNOSIS — I10 Essential (primary) hypertension: Secondary | ICD-10-CM | POA: Diagnosis not present

## 2019-10-03 ENCOUNTER — Ambulatory Visit
Admission: RE | Admit: 2019-10-03 | Discharge: 2019-10-03 | Disposition: A | Discharge status: Home / Self Care | Payer: Medicare HMO | Source: Ambulatory Visit | Attending: Internal Medicine | Admitting: Internal Medicine

## 2019-10-03 ENCOUNTER — Other Ambulatory Visit: Payer: Self-pay

## 2019-10-03 DIAGNOSIS — R1032 Left lower quadrant pain: Secondary | ICD-10-CM | POA: Insufficient documentation

## 2019-10-03 DIAGNOSIS — R1031 Right lower quadrant pain: Secondary | ICD-10-CM | POA: Diagnosis not present

## 2019-10-03 MED ORDER — IOHEXOL 300 MG/ML  SOLN
100.0000 mL | Freq: Once | INTRAMUSCULAR | Status: AC | PRN
  Administered 2019-10-03: 100 mL via INTRAVENOUS

## 2019-11-27 DIAGNOSIS — R21 Rash and other nonspecific skin eruption: Secondary | ICD-10-CM | POA: Diagnosis not present

## 2020-02-11 DIAGNOSIS — R1031 Right lower quadrant pain: Secondary | ICD-10-CM | POA: Diagnosis not present

## 2020-02-11 DIAGNOSIS — M7918 Myalgia, other site: Secondary | ICD-10-CM | POA: Diagnosis not present

## 2020-02-11 DIAGNOSIS — M6289 Other specified disorders of muscle: Secondary | ICD-10-CM | POA: Diagnosis not present

## 2020-03-31 ENCOUNTER — Other Ambulatory Visit: Payer: Self-pay | Admitting: Internal Medicine

## 2020-03-31 DIAGNOSIS — R7989 Other specified abnormal findings of blood chemistry: Secondary | ICD-10-CM | POA: Diagnosis not present

## 2020-03-31 DIAGNOSIS — Z0000 Encounter for general adult medical examination without abnormal findings: Secondary | ICD-10-CM | POA: Diagnosis not present

## 2020-03-31 DIAGNOSIS — Z1231 Encounter for screening mammogram for malignant neoplasm of breast: Secondary | ICD-10-CM

## 2020-03-31 DIAGNOSIS — I10 Essential (primary) hypertension: Secondary | ICD-10-CM | POA: Diagnosis not present

## 2020-03-31 DIAGNOSIS — Z79899 Other long term (current) drug therapy: Secondary | ICD-10-CM | POA: Diagnosis not present

## 2020-03-31 DIAGNOSIS — E785 Hyperlipidemia, unspecified: Secondary | ICD-10-CM | POA: Diagnosis not present

## 2020-06-29 ENCOUNTER — Other Ambulatory Visit: Payer: Self-pay

## 2020-06-29 ENCOUNTER — Ambulatory Visit
Admission: RE | Admit: 2020-06-29 | Discharge: 2020-06-29 | Disposition: A | Discharge status: Home / Self Care | Payer: Medicare HMO | Source: Ambulatory Visit | Attending: Internal Medicine | Admitting: Internal Medicine

## 2020-06-29 DIAGNOSIS — Z1231 Encounter for screening mammogram for malignant neoplasm of breast: Secondary | ICD-10-CM | POA: Insufficient documentation

## 2020-07-03 ENCOUNTER — Other Ambulatory Visit: Payer: Self-pay | Admitting: Internal Medicine

## 2020-07-03 DIAGNOSIS — R928 Other abnormal and inconclusive findings on diagnostic imaging of breast: Secondary | ICD-10-CM

## 2020-07-03 DIAGNOSIS — N6310 Unspecified lump in the right breast, unspecified quadrant: Secondary | ICD-10-CM

## 2020-07-14 ENCOUNTER — Other Ambulatory Visit: Payer: Self-pay

## 2020-07-14 ENCOUNTER — Ambulatory Visit
Admission: RE | Admit: 2020-07-14 | Discharge: 2020-07-14 | Disposition: A | Discharge status: Home / Self Care | Payer: Medicare HMO | Source: Ambulatory Visit | Attending: Internal Medicine | Admitting: Internal Medicine

## 2020-07-14 DIAGNOSIS — N6310 Unspecified lump in the right breast, unspecified quadrant: Secondary | ICD-10-CM

## 2020-07-14 DIAGNOSIS — R928 Other abnormal and inconclusive findings on diagnostic imaging of breast: Secondary | ICD-10-CM | POA: Diagnosis not present

## 2020-07-14 DIAGNOSIS — N6313 Unspecified lump in the right breast, lower outer quadrant: Secondary | ICD-10-CM | POA: Diagnosis not present

## 2020-07-14 DIAGNOSIS — R922 Inconclusive mammogram: Secondary | ICD-10-CM | POA: Diagnosis not present

## 2020-07-21 ENCOUNTER — Other Ambulatory Visit: Payer: Self-pay | Admitting: Internal Medicine

## 2020-07-21 DIAGNOSIS — N6310 Unspecified lump in the right breast, unspecified quadrant: Secondary | ICD-10-CM

## 2020-07-21 DIAGNOSIS — R928 Other abnormal and inconclusive findings on diagnostic imaging of breast: Secondary | ICD-10-CM

## 2020-07-23 ENCOUNTER — Ambulatory Visit
Admission: RE | Admit: 2020-07-23 | Discharge: 2020-07-23 | Disposition: A | Discharge status: Home / Self Care | Payer: Medicare HMO | Source: Ambulatory Visit | Attending: Internal Medicine | Admitting: Internal Medicine

## 2020-07-23 ENCOUNTER — Other Ambulatory Visit: Payer: Self-pay

## 2020-07-23 DIAGNOSIS — R928 Other abnormal and inconclusive findings on diagnostic imaging of breast: Secondary | ICD-10-CM

## 2020-07-23 DIAGNOSIS — N6310 Unspecified lump in the right breast, unspecified quadrant: Secondary | ICD-10-CM

## 2020-07-23 DIAGNOSIS — C50511 Malignant neoplasm of lower-outer quadrant of right female breast: Secondary | ICD-10-CM | POA: Diagnosis not present

## 2020-07-23 DIAGNOSIS — N6313 Unspecified lump in the right breast, lower outer quadrant: Secondary | ICD-10-CM | POA: Diagnosis not present

## 2020-07-23 HISTORY — PX: BREAST BIOPSY: SHX20

## 2020-07-24 ENCOUNTER — Encounter: Payer: Self-pay | Admitting: *Deleted

## 2020-07-24 NOTE — Progress Notes (Signed)
Called patient to establish navigation services.  Patient is newly diagnosed with invasive mammary carcinoma of the right breast.  Patient states she would like to see Dr. Alphonsa Overall or Dr. Janina Mayo at [facility].  I have left the office the patients information with the referral coordinator to contact patient with an appointment.  I have also sent Varney Biles and Arrie Aran, the nurse navigators at [facility] an inbasket message to assist with referral to medical oncology.  Will check back with patient after Labor Day to confirm that she has appointments.

## 2020-07-29 LAB — SURGICAL PATHOLOGY

## 2020-08-06 ENCOUNTER — Other Ambulatory Visit: Payer: Self-pay | Admitting: General Surgery

## 2020-08-06 DIAGNOSIS — C50411 Malignant neoplasm of upper-outer quadrant of right female breast: Secondary | ICD-10-CM | POA: Diagnosis not present

## 2020-08-06 DIAGNOSIS — Z170 Estrogen receptor positive status [ER+]: Secondary | ICD-10-CM

## 2020-08-07 ENCOUNTER — Telehealth: Payer: Self-pay | Admitting: Hematology

## 2020-08-07 NOTE — Progress Notes (Signed)
Jean Schwartz   Telephone:([phone]) [phone] Fax:([phone]) [place] Note   Patient Care Team: Idelle Crouch, MD as PCP - General (Internal Medicine) Mauro Kaufmann, RN as Oncology Nurse Navigator Rockwell Germany, RN as Oncology Nurse Navigator  Date of Service:  08/10/2020   CHIEF COMPLAINTS/PURPOSE OF CONSULTATION:  Newly diagnosed right breast cancer   REFERRING PHYSICIAN:  Dr Donne Hazel   Oncology History Overview Note  Cancer Staging Malignant neoplasm of lower-outer quadrant of right breast of female, estrogen receptor positive ([facility]) Staging form: Breast, AJCC 8th Edition - Clinical stage from 07/23/2020: Stage IA (cT1b, cN0, cM0, G2, ER+, PR+, HER2-) - Signed by Truitt Merle, MD on 08/10/2020    Malignant neoplasm of lower-outer quadrant of right breast of female, estrogen receptor positive ([place])  07/14/2020 Mammogram   Diagnostic Mammogram 07/14/20  IMPRESSION: 1.0 x 1.0 x 0.9cm mass in the 8 o'clock position of the right breast 3cmfn with imaging features suspicious for malignancy.   07/23/2020 Cancer Staging   Staging form: Breast, AJCC 8th Edition - Clinical stage from 07/23/2020: Stage IA (cT1b, cN0, cM0, G2, ER+, PR+, HER2-) - Signed by Truitt Merle, MD on 08/10/2020   07/23/2020 Initial Biopsy   DIAGNOSIS: 07/23/20 A. BREAST, RIGHT 8:00 3 CM FN; ULTRASOUND-GUIDED BIOPSY:  - INVASIVE MAMMARY CARCINOMA, NO SPECIAL TYPE.    07/23/2020 Receptors her2   ADDENDUM:  BREAST BIOMARKER TESTS  Estrogen Receptor (ER) Status: Positive (greater than 10% of cells  demonstrate nuclear positivity)                       Percentage of cells with nuclear positivity:  91-100%                       Average intensity of staining: Strong   Progesterone Receptor (PgR) Status: Positive (greater than 10% of cells  demonstrate nuclear positivity)                       Percentage of cells with nuclear positivity: 51-90%                       Average intensity of staining:  Moderate   HER2 (by immunohistochemistry): Negative (Score 0)    08/10/2020 Initial Diagnosis   Malignant neoplasm of lower-outer quadrant of right breast of female, estrogen receptor positive ([place])      HISTORY OF PRESENTING ILLNESS:  Jean Schwartz 74 y.o. female is a here because of newly diagnosed right breast cancer. The patient was referred by Dr Donne Hazel. The patient presents to the clinic today alone.  Her mass was found by screening mammogram. She did not feel the mass herself. She noes several year ago she required [place] due to abnormal Mammogram, but results benign. This is her first biopsy. She denied and recent breast, nipple or body changes. Today she notes her appetite, eating and weight is baseline. She only starting having right breast pain after biopsy. She denies chest pain, SOB, abdominal pain, bloating or nausea. She notes only arthritis in her fingers. She notes her last lab was 6 months ago.   Socially she is married with 2 adult sons. She is retired from [employer].  She has a PMHx of acid reflux, not on PPI. She is on supplements, calcium, biotin, Vit D, Omega-3, B12 vitamin once daily. She notes having osteoporosis with her PCP, but not  started on bisphosphonate. Her mother had skin cancer and her niece had breast cancer in late 57s. She had very mild and short menopause symptoms.    GYN HISTORY  LMP: 59s HRT: No G2P2: first at age 45.     REVIEW OF SYSTEMS:    Constitutional: Denies fevers, chills or abnormal night sweats Eyes: Denies blurriness of vision, double vision or watery eyes Ears, nose, mouth, throat, and face: Denies mucositis or sore throat Respiratory: Denies cough, dyspnea or wheezes Cardiovascular: Denies palpitation, chest discomfort or lower extremity swelling Gastrointestinal:  Denies nausea, heartburn or change in bowel habits Skin: Denies abnormal skin rashes Lymphatics: Denies new lymphadenopathy or easy  bruising Neurological:Denies numbness, tingling or new weaknesses Behavioral/Psych: Mood is stable, no new changes  All other systems were reviewed with the patient and are negative.  MEDICAL HISTORY:  Past Medical History:  Diagnosis Date   Allergy    Arthritis    Cataract    GERD (gastroesophageal reflux disease)    Osteoporosis     SURGICAL HISTORY: Past Surgical History:  Procedure Laterality Date   BREAST BIOPSY Right 07/23/2020   [place] Bx, Vision clip, path pending    COLONOSCOPY     TUBAL LIGATION      SOCIAL HISTORY: Social History   Socioeconomic History   Marital status: Married    Spouse name: Not on file   Number of children: 2   Years of education: Not on file   Highest education level: Not on file  Occupational History   Occupation: retired  Tobacco Use   Smoking status: Never Smoker   Smokeless tobacco: Never Used  Scientific laboratory technician Use: Never used  Substance and Sexual Activity   Alcohol use: No   Drug use: No   Sexual activity: Not Currently  Other Topics Concern   Not on file  Social History Narrative   Not on file   Social Determinants of Health   Financial Resource Strain:    Difficulty of Paying Living Expenses: Not on file  Food Insecurity:    Worried About Charity fundraiser in the Last Year: Not on file   [place] in the Last Year: Not on file  Transportation Needs:    Lack of Transportation (Medical): Not on file   Lack of Transportation (Non-Medical): Not on file  Physical Activity:    Days of Exercise per Week: Not on file   Minutes of Exercise per Session: Not on file  Stress:    Feeling of Stress : Not on file  Social Connections:    Frequency of Communication with Friends and Family: Not on file   Frequency of Social Gatherings with Friends and Family: Not on file   Attends Religious Services: Not on file   Active Member of Clubs or Organizations: Not on file   Attends Theatre manager Meetings: Not on file   Marital Status: Not on file  Intimate Partner Violence:    Fear of Current or Ex-Partner: Not on file   Emotionally Abused: Not on file   Physically Abused: Not on file   Sexually Abused: Not on file    FAMILY HISTORY: Family History  Problem Relation Age of Onset   Cancer Mother        skin cancer   Cancer Niece 45       breast cancer    Breast cancer Neg Hx    Colon cancer Neg Hx    Esophageal  cancer Neg Hx    Rectal cancer Neg Hx    Stomach cancer Neg Hx     ALLERGIES:  has No Known Allergies.  MEDICATIONS:  Current Outpatient Medications  Medication Sig Dispense Refill   BIOTIN MAXIMUM PO Take 1 tablet daily by mouth.     Calcium Carb-Cholecalciferol (CALCIUM 600 + D) 600-200 MG-UNIT TABS Take by mouth.     cholecalciferol (VITAMIN D) 1000 units tablet Take 1,000 Units daily by mouth.     fluticasone (FLONASE) 50 MCG/ACT nasal spray      Multiple Vitamin (MULTIVITAMIN) tablet Take 1 tablet daily by mouth.     Omega-3 Fatty Acids (FISH OIL OMEGA-3 PO) Take 1 capsule as needed by mouth.     OVER THE COUNTER MEDICATION Garlic 8003 mg. One tablet daily.     Current Facility-Administered Medications  Medication Dose Route Frequency Provider Last Rate Last Admin   0.9 %  sodium chloride infusion  500 mL Intravenous Once Pyrtle, Lajuan Lines, MD        PHYSICAL EXAMINATION: ECOG PERFORMANCE STATUS: 0 - Asymptomatic  Vitals:   08/10/20 1432  BP: (!) 149/66  Pulse: 68  Resp: 18  Temp: 97.8 F (36.6 C)  SpO2: 100%   Filed Weights   08/10/20 1432  Weight: 127 lb 3.2 oz (57.7 kg)    GENERAL:alert, no distress and comfortable SKIN: skin color, texture, turgor are normal, no rashes or significant lesions EYES: normal, Conjunctiva are pink and non-injected, sclera clear  NECK: supple, thyroid normal size, non-tender, without nodularity LYMPH:  no palpable lymphadenopathy in the cervical, axillary  LUNGS: clear to  auscultation and percussion with normal breathing effort HEART: regular rate & rhythm and no murmurs and no lower extremity edema ABDOMEN:abdomen soft, non-tender and normal bowel sounds Musculoskeletal:no cyanosis of digits and no clubbing  NEURO: alert & oriented x 3 with fluent speech, no focal motor/sensory deficits BREAST: (+) Resolving skin ecchymosis of right breast at biopsy site. No Palpable mass, nodules or adenopathy bilaterally. Breast exam benign.  LABORATORY DATA:  I have reviewed the data as listed CBC Latest Ref Rng & Units 08/10/2020 10/03/2017 10/02/2017  WBC 4.0 - 10.5 K/uL 4.9 4.5 4.7  Hemoglobin 12.0 - 15.0 g/dL 13.7 13.0 14.3  Hematocrit 36 - 46 % 40.9 39.0 42.4  Platelets 150 - 400 K/uL 173 173 155    CMP Latest Ref Rng & Units 10/03/2017 10/02/2017  Glucose 65 - 99 mg/dL 106(H) 109(H)  BUN 6 - 20 mg/dL 16 13  Creatinine 0.44 - 1.00 mg/dL 0.86 0.70  Sodium 135 - 145 mmol/L 138 141  Potassium 3.5 - 5.1 mmol/L 3.9 4.1  Chloride 101 - 111 mmol/L 105 106  CO2 22 - 32 mmol/L 26 25  Calcium 8.9 - 10.3 mg/dL 8.9 9.7     RADIOGRAPHIC STUDIES: I have personally reviewed the radiological images as listed and agreed with the findings in the report. US BREAST LTD UNI RIGHT INC AXILLA  Result Date: 07/14/2020 CLINICAL DATA:  Possible mass in the outer right breast on a recent screening mammogram. EXAM: DIGITAL DIAGNOSTIC RIGHT MAMMOGRAM WITH CAD AND TOMO ULTRASOUND RIGHT BREAST COMPARISON:  Previous exam(s). ACR Breast Density Category c: The breast tissue is heterogeneously dense, which may obscure small masses. FINDINGS: 3D tomographic and 2D generated spot compression views of the right breast confirm an irregular mass in the outer right breast in approximately the 8 o'clock position. Mammographic images were processed with CAD. On physical exam, there is  an approximately 1 cm faintly palpable mass in the 8 o'clock position of the right breast, 3 cm from the nipple. There  are no palpable right axillary lymph nodes. Targeted ultrasound is performed, showing a 1.0 x 1.0 x 0.9 cm irregular, vertically oriented, hypoechoic mass with a small amount of internal increased echogenicity and ill-defined surrounding increased echogenicity. This corresponds to the mammographic and palpable mass. Ultrasound of the right axilla demonstrated normal appearing right axillary lymph nodes. IMPRESSION: 1.0 cm mass in the 8 o'clock position of the right breast with imaging features suspicious for malignancy. RECOMMENDATION: Ultrasound-guided core needle biopsy of the 1.0 cm mass in the 8 o'clock position of the right breast. This has been discussed with the patient and we will assist the patient in getting the biopsy scheduled in consultation with her referring provider. I have discussed the findings and recommendations with the patient. If applicable, a reminder letter will be sent to the patient regarding the next appointment. BI-RADS CATEGORY  4: Suspicious. Electronically Signed   By: Claudie Revering M.D.   On: 07/14/2020 15:20   MM DIAG BREAST TOMO UNI RIGHT  Result Date: 07/14/2020 CLINICAL DATA:  Possible mass in the outer right breast on a recent screening mammogram. EXAM: DIGITAL DIAGNOSTIC RIGHT MAMMOGRAM WITH CAD AND TOMO ULTRASOUND RIGHT BREAST COMPARISON:  Previous exam(s). ACR Breast Density Category c: The breast tissue is heterogeneously dense, which may obscure small masses. FINDINGS: 3D tomographic and 2D generated spot compression views of the right breast confirm an irregular mass in the outer right breast in approximately the 8 o'clock position. Mammographic images were processed with CAD. On physical exam, there is an approximately 1 cm faintly palpable mass in the 8 o'clock position of the right breast, 3 cm from the nipple. There are no palpable right axillary lymph nodes. Targeted ultrasound is performed, showing a 1.0 x 1.0 x 0.9 cm irregular, vertically oriented, hypoechoic  mass with a small amount of internal increased echogenicity and ill-defined surrounding increased echogenicity. This corresponds to the mammographic and palpable mass. Ultrasound of the right axilla demonstrated normal appearing right axillary lymph nodes. IMPRESSION: 1.0 cm mass in the 8 o'clock position of the right breast with imaging features suspicious for malignancy. RECOMMENDATION: Ultrasound-guided core needle biopsy of the 1.0 cm mass in the 8 o'clock position of the right breast. This has been discussed with the patient and we will assist the patient in getting the biopsy scheduled in consultation with her referring provider. I have discussed the findings and recommendations with the patient. If applicable, a reminder letter will be sent to the patient regarding the next appointment. BI-RADS CATEGORY  4: Suspicious. Electronically Signed   By: Claudie Revering M.D.   On: 07/14/2020 15:20   MM CLIP PLACEMENT RIGHT  Result Date: 07/23/2020 CLINICAL DATA:  Evaluate biopsy marker EXAM: DIAGNOSTIC RIGHT MAMMOGRAM POST ULTRASOUND BIOPSY COMPARISON:  Previous exam(s). FINDINGS: Mammographic images were obtained following ultrasound guided biopsy of a right breast mass. The biopsy marking clip is in expected position at the site of biopsy. IMPRESSION: Appropriate positioning of the Q shaped biopsy marking clip at the site of biopsy in the location of the biopsied breast mass. Final Assessment: Post Procedure Mammograms for Marker Placement Electronically Signed   By: Dorise Bullion III M.D   On: 07/23/2020 14:02   [place] RT BREAST BX W LOC DEV 1ST LESION IMG BX SPEC US GUIDE  Addendum Date: 08/03/2020   ADDENDUM REPORT: 07/24/2020 14:58 ADDENDUM: PATHOLOGY revealed: A.  BREAST, RIGHT 8:00 3 CM FN; ULTRASOUND-GUIDED BIOPSY: - INVASIVE MAMMARY CARCINOMA, NO SPECIAL TYPE. 11 mm in this sample. Grade 2. Ductal carcinoma in situ: Not identified. Lymphovascular invasion: Not identified. Pathology results are CONCORDANT  with imaging findings, per Dr. Dorise Bullion. Pathology results and recommendations below were discussed with patient by telephone on 07/24/2020. Patient reported biopsy site within normal limits with slight tenderness at the site. Post biopsy care instructions were reviewed, questions were answered and my direct phone number was provided to patient. Patient was instructed to call [facility] if any concerns or questions arise related to the biopsy. Recommendation: Request for surgical consultation was relayed to Al Pimple RN and Tanya Nones RN at [facility] by Electa Sniff RN on 07/24/2020. Pathology results reported by Electa Sniff RN on 07/24/2020. Electronically Signed   By: Dorise Bullion III M.D   On: 07/24/2020 14:58   Result Date: 08/03/2020 CLINICAL DATA:  Biopsy of a right breast mass at 8 o'clock EXAM: ULTRASOUND GUIDED RIGHT BREAST CORE NEEDLE BIOPSY COMPARISON:  Previous exam(s). PROCEDURE: I met with the patient and we discussed the procedure of ultrasound-guided biopsy, including benefits and alternatives. We discussed the high likelihood of a successful procedure. We discussed the risks of the procedure, including infection, bleeding, tissue injury, clip migration, and inadequate sampling. Informed written consent was given. The usual time-out protocol was performed immediately prior to the procedure. Lesion quadrant: 8 o'clock Using sterile technique and 1% Lidocaine as local anesthetic, under direct ultrasound visualization, a 12 gauge spring-loaded device was used to perform biopsy of an 8 o'clock right breast mass using a lateral approach. At the conclusion of the procedure a Q shaped tissue marker clip was deployed into the biopsy cavity. Follow up 2 view mammogram was performed and dictated separately. IMPRESSION: Ultrasound guided biopsy of a right breast mass at 8 o'clock. No apparent complications. Electronically Signed: By: Dorise Bullion III M.D On:  07/23/2020 13:51    ASSESSMENT & PLAN:  Jean Schwartz is a 74 y.o. Caucasian female with a history of Arthritis, Cataract, GERD, Osteoporosis.   1. Malignant neoplasm of lower-outer quadrant of right breast, Stage IA, c(T1bN0M0), ER+/PR+/HER2-, Grade II -We discussed her image findings and the biopsy results in great details. She has 1cm mass in 8:00 position of her right breast and biopsy showed invasive mammary carcinoma.   -Given the early stage disease, she is a candidate for lumpectomy. She is agreeable with that. She will proceed with right lumpectomy with Dr Donne Hazel on 08/20/20. Given her aga, she may not need SLN biopsy  -If her surgical tumor is more than 1cm, I recommend a Oncotype Dx test on the surgical sample and we'll make a decision about adjuvant chemotherapy based on the Oncotype result. Written material of this test was given to her. She is fit and healthy for her age and would be a good candidate for chemotherapy if her Oncotype recurrence score is high. Certainly I may skip chemo if her RS is in 26-31 range due to her advanced age  -The risk of recurrence depends on the stage and biology of the tumor. She is early stage, with ER/PR positive and HER2 negative markers. I discussed this is the more common type of slow growing tumor.  -Given her age and low risk features she may not need Adjuvant radiation. I will discuss her case in Breast Tumor Board, if RT is recommend I will refer her to [facility].  -Given  the strong ER and PR expression in her postmenopausal status, I recommend adjuvant endocrine therapy with aromatase inhibitor with anastrozole for 5 years to reduce the risk of cancer recurrence. Potential benefits and side effects were discussed with patient and she is interested. I gave her print out information of Anastrozole.  --The potential benefit and side effects, which includes but not limited to, hot flash, skin and vaginal dryness, metabolic changes ( increased  blood glucose, cholesterol, weight, etc.), slightly in increased risk of cardiovascular disease, cataracts, muscular and joint discomfort, osteopenia and osteoporosis, etc, were discussed with her in great details.  -We also discussed the breast cancer surveillance after her surgery. She will continue annual screening mammogram, self exam, and a routine office visit with lab and exam with [place]. -F/u after Surgery or Radiation.  -She has not had COVID19 vaccine. I offered to give Vaccine in clinic and she declined.    2. Osteoporosis -Seen in DEXA in the past 1-2 years at PCP office. Not on bisphosphonate, but on Calcium and Vit D.  -I will request DEXA scan report form PCP office. She is agreeable.     PLAN:  -Baseline Lab today including CBC and CMP  -Request DEXA from PCP office  -Proceed with surgery on 08/20/20  -F/u after Surgery or Radiation.  -I left a message for pathology to see if they can diagnose her tumor subtype (ductal vs lobular)   Orders Placed This Encounter  Procedures   CBC with Differential ([place] Only)    Standing Status:   Standing    Number of Occurrences:   10    Standing Expiration Date:   08/10/2021   CMP ([place] [facility] only)    Standing Status:   Standing    Number of Occurrences:   10    Standing Expiration Date:   08/10/2021    All questions were answered. The patient knows to call the clinic with any problems, questions or concerns. The total time spent in the appointment was 45 minutes.     Truitt Merle, MD 08/10/2020 4:16 PM  I, Joslyn Devon, am acting as scribe for Truitt Merle, MD.   I have reviewed the above documentation for accuracy and completeness, and I agree with the above.

## 2020-08-07 NOTE — Telephone Encounter (Signed)
Scheduled per 9/17 referral msg. Called and spoke with pt, confirmed 9/20 appt. Pt made aware to arrive 69mins before appt time and to bring photo ID and insurances cards.

## 2020-08-10 ENCOUNTER — Other Ambulatory Visit: Payer: Self-pay

## 2020-08-10 ENCOUNTER — Inpatient Hospital Stay: Payer: Medicare HMO | Admitting: Hematology

## 2020-08-10 ENCOUNTER — Inpatient Hospital Stay: Payer: Medicare HMO | Attending: Hematology

## 2020-08-10 ENCOUNTER — Encounter: Payer: Self-pay | Admitting: Hematology

## 2020-08-10 ENCOUNTER — Other Ambulatory Visit: Payer: Self-pay | Admitting: General Surgery

## 2020-08-10 DIAGNOSIS — Z803 Family history of malignant neoplasm of breast: Secondary | ICD-10-CM | POA: Insufficient documentation

## 2020-08-10 DIAGNOSIS — C50411 Malignant neoplasm of upper-outer quadrant of right female breast: Secondary | ICD-10-CM

## 2020-08-10 DIAGNOSIS — C50511 Malignant neoplasm of lower-outer quadrant of right female breast: Secondary | ICD-10-CM | POA: Insufficient documentation

## 2020-08-10 DIAGNOSIS — Z170 Estrogen receptor positive status [ER+]: Secondary | ICD-10-CM | POA: Diagnosis not present

## 2020-08-10 DIAGNOSIS — Z79899 Other long term (current) drug therapy: Secondary | ICD-10-CM | POA: Insufficient documentation

## 2020-08-10 DIAGNOSIS — M810 Age-related osteoporosis without current pathological fracture: Secondary | ICD-10-CM | POA: Diagnosis not present

## 2020-08-10 DIAGNOSIS — K219 Gastro-esophageal reflux disease without esophagitis: Secondary | ICD-10-CM | POA: Insufficient documentation

## 2020-08-10 LAB — CBC WITH DIFFERENTIAL (CANCER CENTER ONLY)
Abs Immature Granulocytes: 0.02 10*3/uL (ref 0.00–0.07)
Basophils Absolute: 0 10*3/uL (ref 0.0–0.1)
Basophils Relative: 1 %
Eosinophils Absolute: 0.3 10*3/uL (ref 0.0–0.5)
Eosinophils Relative: 6 %
HCT: 40.9 % (ref 36.0–46.0)
Hemoglobin: 13.7 g/dL (ref 12.0–15.0)
Immature Granulocytes: 0 %
Lymphocytes Relative: 24 %
Lymphs Abs: 1.2 10*3/uL (ref 0.7–4.0)
MCH: 29 pg (ref 26.0–34.0)
MCHC: 33.5 g/dL (ref 30.0–36.0)
MCV: 86.5 fL (ref 80.0–100.0)
Monocytes Absolute: 0.4 10*3/uL (ref 0.1–1.0)
Monocytes Relative: 8 %
Neutro Abs: 3 10*3/uL (ref 1.7–7.7)
Neutrophils Relative %: 61 %
Platelet Count: 173 10*3/uL (ref 150–400)
RBC: 4.73 MIL/uL (ref 3.87–5.11)
RDW: 12.6 % (ref 11.5–15.5)
WBC Count: 4.9 10*3/uL (ref 4.0–10.5)
nRBC: 0 % (ref 0.0–0.2)

## 2020-08-10 LAB — CMP (CANCER CENTER ONLY)
ALT: 22 U/L (ref 0–44)
AST: 24 U/L (ref 15–41)
Albumin: 4.1 g/dL (ref 3.5–5.0)
Alkaline Phosphatase: 65 U/L (ref 38–126)
Anion gap: 7 (ref 5–15)
BUN: 13 mg/dL (ref 8–23)
CO2: 27 mmol/L (ref 22–32)
Calcium: 9.7 mg/dL (ref 8.9–10.3)
Chloride: 108 mmol/L (ref 98–111)
Creatinine: 0.78 mg/dL (ref 0.44–1.00)
GFR, Est AFR Am: >60 mL/min (ref >60)
GFR, Estimated: >60 mL/min (ref >60)
Glucose, Bld: 88 mg/dL (ref 70–99)
Potassium: 4.8 mmol/L (ref 3.5–5.1)
Sodium: 142 mmol/L (ref 135–145)
Total Bilirubin: 0.6 mg/dL (ref 0.3–1.2)
Total Protein: 6.7 g/dL (ref 6.5–8.1)

## 2020-08-11 ENCOUNTER — Ambulatory Visit: Payer: Medicare HMO | Attending: Surgery | Admitting: Physical Therapy

## 2020-08-11 ENCOUNTER — Telehealth: Payer: Self-pay | Admitting: *Deleted

## 2020-08-11 ENCOUNTER — Encounter: Payer: Self-pay | Admitting: *Deleted

## 2020-08-11 ENCOUNTER — Encounter: Payer: Self-pay | Admitting: Physical Therapy

## 2020-08-11 ENCOUNTER — Encounter: Payer: Self-pay | Admitting: Hematology

## 2020-08-11 DIAGNOSIS — C50411 Malignant neoplasm of upper-outer quadrant of right female breast: Secondary | ICD-10-CM | POA: Insufficient documentation

## 2020-08-11 DIAGNOSIS — R293 Abnormal posture: Secondary | ICD-10-CM | POA: Diagnosis not present

## 2020-08-11 DIAGNOSIS — Z170 Estrogen receptor positive status [ER+]: Secondary | ICD-10-CM | POA: Insufficient documentation

## 2020-08-11 NOTE — Telephone Encounter (Signed)
Left message for a return phone call to introduce navigation resources at the cancer center. Left contact info on her voicemail.

## 2020-08-11 NOTE — Therapy (Addendum)
[place], [place], [address] Phone: [phone]   Fax:  [phone]  Physical Therapy Evaluation  Patient Details  Name: Jean Schwartz MRN: [id number] Date of Birth: Apr 05, 1946 Referring Provider (PT): Ave Filter Date: 08/11/2020   PT End of Session - 08/11/20 1157    Visit Number 1    Number of Visits 2    Date for PT Re-Evaluation 10/06/20    PT Start Time 1104    PT Stop Time 1145    PT Time Calculation (min) 41 min    Activity Tolerance Patient tolerated treatment well    Behavior During Therapy [facility] for tasks assessed/performed           Past Medical History:  Diagnosis Date  . Allergy   . Arthritis   . Cataract   . GERD (gastroesophageal reflux disease)   . Osteoporosis     Past Surgical History:  Procedure Laterality Date  . BREAST BIOPSY Right 07/23/2020   [place] Bx, Vision clip, path pending   . COLONOSCOPY    . TUBAL LIGATION      There were no vitals filed for this visit.    Subjective Assessment - 08/11/20 1107    Subjective I am doing ok. I do not have any shoulder issues.    Pertinent History R breast cancer, plan is to undergo a R breast lumpectomy and SLNB on 08/20/20 ER/PR+, HER2-, unsure if she will need radiation    Patient Stated Goals to gain info from provider    Currently in Pain? No/denies    Pain Score 0-No pain              [facility] PT Assessment - 08/11/20 0001      Assessment   Medical Diagnosis right breast cancer    Referring Provider (PT) Donne Hazel    Onset Date/Surgical Date 08/20/20    Hand Dominance Left    Prior Therapy none      Precautions   Precautions Other (comment)    Precaution Comments active cancer      Restrictions   Weight Bearing Restrictions No      Balance Screen   Has the patient fallen in the past 6 months No    Has the patient had a decrease in activity level because of a fear of falling?  No    Is the  patient reluctant to leave their home because of a fear of falling?  No      Home Ecologist residence    Living Arrangements Spouse/significant other    Available Help at Discharge Family    Type of [place]      Prior Function   Level of [place] Retired    Leisure pt does not exercise but does a lot of work around [employer] and [place]   Overall Cognitive Status Within [place] for tasks assessed      Posture/Postural Control   Posture/Postural Control Postural limitations    Postural Limitations Rounded Shoulders;Forward head      ROM / Strength   AROM / PROM / Strength AROM      AROM   AROM Assessment Site Shoulder    Right/Left Shoulder Left;Right    Right Shoulder Flexion 153 Degrees    Right Shoulder ABduction 155 Degrees    Right Shoulder Internal Rotation 77 Degrees  Right Shoulder External Rotation 87 Degrees    Left Shoulder Flexion 165 Degrees    Left Shoulder ABduction 155 Degrees    Left Shoulder Internal Rotation 68 Degrees    Left Shoulder External Rotation 62 Degrees             LYMPHEDEMA/ONCOLOGY QUESTIONNAIRE - 08/11/20 0001      Type   Cancer Type right breast cancer      Surgeries   Lumpectomy Date 08/20/20    Sentinel Lymph Node Biopsy Date 08/20/20      Lymphedema Assessments   Lymphedema Assessments Upper extremities      Right Upper Extremity Lymphedema   15 cm Proximal to Olecranon Process 26.1 cm    Olecranon Process 22.6 cm    15 cm Proximal to Ulnar Styloid Process 21.2 cm    Just Proximal to Ulnar Styloid Process 14 cm    Across Hand at [employer] 17 cm    At [place] of 2nd Digit 5.3 cm      Left Upper Extremity Lymphedema   15 cm Proximal to Olecranon Process 26.7 cm    Olecranon Process 23.5 cm    15 cm Proximal to Ulnar Styloid Process 22.5 cm    Just Proximal to Ulnar Styloid Process 13.8 cm    Across Hand at [employer] 17 cm    At [place] of 2nd Digit 5.4 cm           L-DEX FLOWSHEETS - 08/11/20 1200      L-DEX LYMPHEDEMA SCREENING   Measurement Type Unilateral    L-DEX MEASUREMENT EXTREMITY Upper Extremity    POSITION  Standing    DOMINANT SIDE Left    At Risk Side Right    BASELINE SCORE (UNILATERAL) 3.9            The patient was assessed using the L-Dex machine today to produce a lymphedema index baseline score. The patient will be reassessed on a regular basis (typically every 3 months) to obtain new L-Dex scores. If the score is > 6.5 points away from his/her baseline score indicating onset of subclinical lymphedema, it will be recommended to wear a compression garment for 4 weeks, 12 hours per day and then be reassessed. If the score continues to be > 6.5 points from baseline at reassessment, we will initiate lymphedema treatment. Assessing in this manner has a 95% rate of preventing clinically significant lymphedema.       Objective measurements completed on examination: See above findings.               PT Education - 08/11/20 1347    Education Details Educated pt about signs and symptoms of lymphedema as well as anatomy and physiology of lymphatic system. Educated her in importance of staying as active as possible throughout treatment to decreas fatigue and in head and neck ROM exercises to decrease loss of ROM    Person(s) Educated Patient    Methods Explanation;Handout    Comprehension Verbalized understanding               PT Long Term Goals - 08/11/20 1200      PT LONG TERM GOAL #1   Title Pt will return to baseline shoulder ROM to allow pt to return to PLOF.    Time 8    Period Weeks    Status New    Target Date 10/06/20           Breast Clinic Goals -  08/11/20 1200      Patient will be able to verbalize understanding of pertinent lymphedema risk reduction practices relevant to her diagnosis specifically related to skin care.   Status Achieved        Patient will be able to return demonstrate and/or verbalize understanding of the post-op home exercise program related to regaining shoulder range of motion.   Status Achieved      Patient will be able to verbalize understanding of the importance of attending the postoperative After Breast Cancer Class for further lymphedema risk reduction education and therapeutic exercise.   Status Achieved                 Plan - 08/11/20 1157    Clinical Impression Statement Pt presents to PT with recently diagnosed R breast cancer ER/PR+, HER2-. The plan is for her to undergo a R lumpectomy and SLNB on 08/20/20. She may require radiation.Pt was instructed in post op exercises and educated that if she has a mastectomy then not to begin exercises until a week after the last drain is removed. Baseline ROM and SOZO measurements taken today. She will benefit from a post op PT reassessment to determine needs and in every 3 months for additonal L dex screening to detect subclinical lymphedema.    Stability/Clinical Decision Making Stable/Uncomplicated    Clinical Decision Making Low    Rehab Potential Good    PT Frequency --   eval and 1 post op f/u   PT Duration 8 weeks    PT Treatment/Interventions ADLs/Self Care Home Management;Therapeutic exercise;Patient/family education    PT Next Visit Plan reassess baselines    PT Home Exercise Plan post op breast exercises    Consulted and Agree with Plan of Care Patient           Patient will benefit from skilled therapeutic intervention in order to improve the following deficits and impairments:  Postural dysfunction, Decreased knowledge of precautions  Visit Diagnosis: Abnormal posture - Plan: PT plan of care cert/re-cert  Malignant neoplasm of upper-outer quadrant of right breast in female, estrogen receptor positive ([place]) - Plan: PT plan of care cert/re-cert   Patient will follow up at outpatient cancer rehab 3-4 weeks following surgery.  If the  patient requires physical therapy at that time, a specific plan will be dictated and sent to the referring physician for approval. The patient was educated today on appropriate basic range of motion exercises to begin post operatively and the importance of attending the After Breast Cancer class following surgery.  Patient was educated today on lymphedema risk reduction practices as it pertains to recommendations that will benefit the patient immediately following surgery.  She verbalized good understanding.     Problem List Patient Active Problem List   Diagnosis Date Noted  . Malignant neoplasm of lower-outer quadrant of right breast of female, estrogen receptor positive ([place]) 08/10/2020  . Chest heaviness 10/02/2017    Allyson Sabal [facility] 08/11/2020, 1:47 PM  [place], [place], [address] Phone: [phone]   Fax:  [phone]  Name: Jean Schwartz MRN: [id number] Date of Birth: 1946/07/03  Manus Gunning, PT 08/11/20 1:47 PM

## 2020-08-12 ENCOUNTER — Telehealth: Payer: Self-pay | Admitting: Hematology

## 2020-08-12 NOTE — Telephone Encounter (Signed)
No 9/20 los.  

## 2020-08-15 ENCOUNTER — Other Ambulatory Visit (HOSPITAL_COMMUNITY): Payer: Medicare HMO

## 2020-08-17 ENCOUNTER — Other Ambulatory Visit (HOSPITAL_COMMUNITY): Payer: Medicare HMO

## 2020-08-17 ENCOUNTER — Inpatient Hospital Stay (HOSPITAL_COMMUNITY): Admission: RE | Admit: 2020-08-17 | Payer: Medicare HMO | Source: Ambulatory Visit

## 2020-08-17 NOTE — Pre-Procedure Instructions (Signed)
[place], [place] - [address] [place] [address] Phone: [phone] Fax: [phone]     Your procedure is scheduled on Friday, October 1, from 07:30 AM- 08:30 AM.  Report to [facility] Entrance "A" at 05:30 A.M., and check in at the Admitting office.  Call this number if you have problems the morning of surgery:  [phone].  Call [phone] if you have any questions prior to your surgery date Monday-Friday 8am-4pm.    Remember:  Do not eat after midnight the night before your surgery.  You may drink clear liquids until 04:30 AM the morning of your surgery.   Clear liquids allowed are: Water, Non-Citrus Juices (without pulp), Carbonated Beverages, Clear Tea, Black Coffee Only, and Gatorade. .  . The day of surgery (if you do NOT have diabetes):  o Drink ONE (1) Pre-Surgery Clear Ensure by 04:30 AM the morning of surgery.   o This drink was given to you during your hospital pre-op appointment visit. o Nothing else to drink after completing the  Pre-Surgery Clear Ensure.         If you have questions, please contact your surgeon's office.     Take these medicines the morning of surgery with A SIP OF WATER: fluticasone (FLONASE) nasal spray   As of today, STOP taking any Aspirin (unless otherwise instructed by your surgeon) Aleve, Naproxen, Ibuprofen, Motrin, Advil, Goody's, BC's, all herbal medications, fish oil, and all vitamins.      The Morning of Surgery:                Do not wear jewelry, make up, or nail polish.            Do not wear lotions, powders, perfumes, or deodorant.            Do not shave 48 hours prior to surgery.             Do not bring valuables to the hospital.            [facility] is not responsible for any belongings or valuables.  Do NOT Smoke (Tobacco/Vaping) or drink Alcohol 24 hours prior to your procedure If you use a CPAP at night, you may bring all equipment for your overnight  stay.   Contacts, glasses, dentures or bridgework may not be worn into surgery.      For patients admitted to the hospital, discharge time will be determined by your treatment team.   Patients discharged the day of surgery will not be allowed to drive home, and someone needs to stay with them for 24 hours.    Special instructions:   [place]- Preparing For Surgery  Before surgery, you can play an important role. Because skin is not sterile, your skin needs to be as free of germs as possible. You can reduce the number of germs on your skin by washing with CHG (chlorahexidine gluconate) Soap before surgery.  CHG is an antiseptic cleaner which kills germs and bonds with the skin to continue killing germs even after washing.    Oral Hygiene is also important to reduce your risk of infection.  Remember - BRUSH YOUR TEETH THE MORNING OF SURGERY WITH YOUR REGULAR TOOTHPASTE  Please do not use if you have an allergy to CHG or antibacterial soaps. If your skin becomes reddened/irritated stop using the CHG.  Do not shave (including legs and underarms) for at least 48 hours prior to first  CHG shower. It is OK to shave your face.  Please follow these instructions carefully.   1. Shower the NIGHT BEFORE SURGERY and the MORNING OF SURGERY with CHG Soap.   2. If you chose to wash your hair, wash your hair first as usual with your normal shampoo.  3. After you shampoo, rinse your hair and body thoroughly to remove the shampoo.  4. Use CHG as you would any other liquid soap. You can apply CHG directly to the skin and wash gently with a scrungie or a clean washcloth.   5. Apply the CHG Soap to your body ONLY FROM THE NECK DOWN.  Do not use on open wounds or open sores. Avoid contact with your eyes, ears, mouth and genitals (private parts). Wash Face and genitals (private parts)  with your normal soap.   6. Wash thoroughly, paying special attention to the area where your surgery will be  performed.  7. Thoroughly rinse your body with warm water from the neck down.  8. DO NOT shower/wash with your normal soap after using and rinsing off the CHG Soap.  9. Pat yourself dry with a CLEAN TOWEL.  10. Wear CLEAN PAJAMAS to bed the night before surgery  11. Place CLEAN SHEETS on your bed the night of your first shower and DO NOT SLEEP WITH PETS.   Day of Surgery: SHOWER Wear Clean/Comfortable clothing the morning of surgery Do not apply any deodorants/lotions.   Remember to brush your teeth WITH YOUR REGULAR TOOTHPASTE.   Please read over the following fact sheets that you were given.

## 2020-08-18 ENCOUNTER — Encounter (HOSPITAL_COMMUNITY)
Admission: RE | Admit: 2020-08-18 | Discharge: 2020-08-18 | Disposition: A | Discharge status: Home / Self Care | Payer: Medicare HMO | Source: Ambulatory Visit | Attending: General Surgery | Admitting: General Surgery

## 2020-08-18 ENCOUNTER — Other Ambulatory Visit (HOSPITAL_COMMUNITY)
Admission: RE | Admit: 2020-08-18 | Discharge: 2020-08-18 | Disposition: A | Discharge status: Home / Self Care | Payer: Medicare HMO | Source: Ambulatory Visit | Attending: General Surgery | Admitting: General Surgery

## 2020-08-18 ENCOUNTER — Other Ambulatory Visit: Payer: Self-pay

## 2020-08-18 ENCOUNTER — Encounter (HOSPITAL_COMMUNITY): Payer: Self-pay

## 2020-08-18 DIAGNOSIS — Z01812 Encounter for preprocedural laboratory examination: Secondary | ICD-10-CM | POA: Diagnosis not present

## 2020-08-18 DIAGNOSIS — Z20822 Contact with and (suspected) exposure to covid-19: Secondary | ICD-10-CM | POA: Insufficient documentation

## 2020-08-18 DIAGNOSIS — Z01818 Encounter for other preprocedural examination: Secondary | ICD-10-CM | POA: Insufficient documentation

## 2020-08-18 HISTORY — DX: Other complications of anesthesia, initial encounter: T88.59XA

## 2020-08-18 HISTORY — DX: Malignant neoplasm of unspecified site of unspecified female breast: C50.919

## 2020-08-18 LAB — SARS CORONAVIRUS 2 (TAT 6-24 HRS): SARS Coronavirus 2: NEGATIVE

## 2020-08-18 NOTE — Progress Notes (Signed)
PCP - Dr. Fulton Reek Cardiologist - denies  PPM/ICD - denies  Chest x-ray - N/A EKG - N/A Stress Test - 10/03/2017 ECHO - denies Cardiac Cath - denies  Sleep Study - denies CPAP - Na  DM: denies  Blood Thinner Instructions: N/A Aspirin Instructions: N/a  ERAS Protcol - Yes PRE-SURGERY Ensure or G2- Ensure given  COVID TEST- Scheduled for today. Patient verbalized understanding of self-quarantine instructions, appointment time and place.  Anesthesia review: YES, seed placement scheduled for 08/20/2020  Patient denies shortness of breath, fever, cough and chest pain at PAT appointment  All instructions explained to the patient, with a verbal understanding of the material. Patient agrees to go over the instructions while at home for a better understanding. Patient also instructed to self quarantine after being tested for COVID-19. The opportunity to ask questions was provided.

## 2020-08-19 NOTE — Anesthesia Preprocedure Evaluation (Addendum)
Anesthesia Evaluation    Reviewed: Allergy & Precautions, H&P , Patient's Chart, lab work & pertinent test results  History of Anesthesia Complications (+) PROLONGED EMERGENCE and history of anesthetic complications  Airway Mallampati: I  TM Distance: >3 FB Neck ROM: Full    Dental no notable dental hx. (+) Dental Advisory Given, Teeth Intact   Pulmonary neg pulmonary ROS,    Pulmonary exam normal breath sounds clear to auscultation       Cardiovascular Exercise Tolerance: Good negative cardio ROS Normal cardiovascular exam Rhythm:Regular Rate:Normal     Neuro/Psych negative neurological ROS  negative psych ROS   GI/Hepatic Neg liver ROS, GERD  ,  Endo/Other  negative endocrine ROS  Renal/GU negative Renal ROS  negative genitourinary   Musculoskeletal  (+) Arthritis , Osteoarthritis,    Abdominal   Peds  Hematology negative hematology ROS (+)   Anesthesia Other Findings   Reproductive/Obstetrics negative OB ROS                            Anesthesia Physical Anesthesia Plan  ASA: III  Anesthesia Plan: General   Post-op Pain Management: GA combined w/ Regional for post-op pain   Induction:   PONV Risk Score and Plan: 3  Airway Management Planned: LMA and Oral ETT  Additional Equipment:   Intra-op Plan:   Post-operative Plan: Extubation in OR  Informed Consent: I have reviewed the patients History and Physical, chart, labs and discussed the procedure including the risks, benefits and alternatives for the proposed anesthesia with the patient or authorized representative who has indicated his/her understanding and acceptance.     Dental advisory given  Plan Discussed with: Anesthesiologist  Anesthesia Plan Comments: (In 2018 pt was evaluated for chest pain and hypertension after dental injection of numbing medication.  Patient had borderline troponin but trended to normal  range.  Stress test was a low risk scan.  Blood pressure normalized. Per notes, it was believed to be reaction to the dental injection of numbing medication.  Nuclear stress 10/03/17:  Normal pharmacologic myocardial perfusion stress test without evidence of ischemia or scar.  The left ventricular ejection fraction is hyperdynamic (>65%).  Low risk study.  )       Anesthesia Quick Evaluation

## 2020-08-20 ENCOUNTER — Other Ambulatory Visit: Payer: Self-pay

## 2020-08-20 ENCOUNTER — Ambulatory Visit (HOSPITAL_COMMUNITY): Payer: Medicare HMO

## 2020-08-20 ENCOUNTER — Ambulatory Visit
Admission: RE | Admit: 2020-08-20 | Discharge: 2020-08-20 | Disposition: A | Discharge status: Home / Self Care | Payer: Medicare HMO | Source: Ambulatory Visit | Attending: General Surgery | Admitting: General Surgery

## 2020-08-20 DIAGNOSIS — Z170 Estrogen receptor positive status [ER+]: Secondary | ICD-10-CM

## 2020-08-20 DIAGNOSIS — C50511 Malignant neoplasm of lower-outer quadrant of right female breast: Secondary | ICD-10-CM | POA: Diagnosis not present

## 2020-08-21 ENCOUNTER — Encounter (HOSPITAL_COMMUNITY)
Admission: RE | Disposition: A | Discharge status: Home / Self Care | Payer: Self-pay | Source: Home / Self Care | Attending: General Surgery

## 2020-08-21 ENCOUNTER — Other Ambulatory Visit: Payer: Self-pay

## 2020-08-21 ENCOUNTER — Ambulatory Visit
Admission: RE | Admit: 2020-08-21 | Discharge: 2020-08-21 | Disposition: A | Discharge status: Home / Self Care | Payer: Medicare HMO | Source: Ambulatory Visit | Attending: General Surgery | Admitting: General Surgery

## 2020-08-21 ENCOUNTER — Ambulatory Visit (HOSPITAL_COMMUNITY): Payer: Medicare HMO | Admitting: Physician Assistant

## 2020-08-21 ENCOUNTER — Encounter (HOSPITAL_COMMUNITY): Payer: Self-pay | Admitting: General Surgery

## 2020-08-21 ENCOUNTER — Encounter (HOSPITAL_COMMUNITY)
Admission: RE | Admit: 2020-08-21 | Discharge: 2020-08-21 | Disposition: A | Discharge status: Home / Self Care | Payer: Medicare HMO | Source: Ambulatory Visit | Attending: General Surgery | Admitting: General Surgery

## 2020-08-21 ENCOUNTER — Ambulatory Visit (HOSPITAL_COMMUNITY)
Admission: RE | Admit: 2020-08-21 | Discharge: 2020-08-21 | Disposition: A | Discharge status: Home / Self Care | Payer: Medicare HMO | Attending: General Surgery | Admitting: General Surgery

## 2020-08-21 ENCOUNTER — Ambulatory Visit (HOSPITAL_COMMUNITY): Payer: Medicare HMO | Admitting: Anesthesiology

## 2020-08-21 DIAGNOSIS — C50411 Malignant neoplasm of upper-outer quadrant of right female breast: Secondary | ICD-10-CM | POA: Insufficient documentation

## 2020-08-21 DIAGNOSIS — M1990 Unspecified osteoarthritis, unspecified site: Secondary | ICD-10-CM | POA: Insufficient documentation

## 2020-08-21 DIAGNOSIS — G8918 Other acute postprocedural pain: Secondary | ICD-10-CM | POA: Diagnosis not present

## 2020-08-21 DIAGNOSIS — C50911 Malignant neoplasm of unspecified site of right female breast: Secondary | ICD-10-CM | POA: Diagnosis not present

## 2020-08-21 DIAGNOSIS — Z170 Estrogen receptor positive status [ER+]: Secondary | ICD-10-CM

## 2020-08-21 DIAGNOSIS — C50511 Malignant neoplasm of lower-outer quadrant of right female breast: Secondary | ICD-10-CM | POA: Diagnosis not present

## 2020-08-21 DIAGNOSIS — K219 Gastro-esophageal reflux disease without esophagitis: Secondary | ICD-10-CM | POA: Diagnosis not present

## 2020-08-21 HISTORY — PX: BREAST LUMPECTOMY WITH RADIOACTIVE SEED AND SENTINEL LYMPH NODE BIOPSY: SHX6550

## 2020-08-21 SURGERY — BREAST LUMPECTOMY WITH RADIOACTIVE SEED AND SENTINEL LYMPH NODE BIOPSY
Anesthesia: General | Site: Breast | Laterality: Right

## 2020-08-21 MED ORDER — LIDOCAINE 2% (20 MG/ML) 5 ML SYRINGE
INTRAMUSCULAR | Status: DC | PRN
  Administered 2020-08-21: 40 mg via INTRAVENOUS

## 2020-08-21 MED ORDER — CEFAZOLIN SODIUM-DEXTROSE 2-4 GM/100ML-% IV SOLN
2.0000 g | INTRAVENOUS | Status: AC
  Administered 2020-08-21: 2 g via INTRAVENOUS

## 2020-08-21 MED ORDER — FENTANYL CITRATE (PF) 100 MCG/2ML IJ SOLN: 25.0000 ug | INTRAMUSCULAR | Status: DC | PRN

## 2020-08-21 MED ORDER — DEXAMETHASONE SODIUM PHOSPHATE 10 MG/ML IJ SOLN
INTRAMUSCULAR | Status: DC | PRN
  Administered 2020-08-21: 10 mg

## 2020-08-21 MED ORDER — FENTANYL CITRATE (PF) 100 MCG/2ML IJ SOLN
INTRAMUSCULAR | Status: AC
  Filled 2020-08-21: qty 2

## 2020-08-21 MED ORDER — TECHNETIUM TC 99M SULFUR COLLOID FILTERED
1.0000 | Freq: Once | INTRAVENOUS | Status: AC | PRN
  Administered 2020-08-21: 1 via INTRADERMAL

## 2020-08-21 MED ORDER — SODIUM CHLORIDE 0.9% FLUSH: 3.0000 mL | INTRAVENOUS | Status: DC | PRN

## 2020-08-21 MED ORDER — MIDAZOLAM HCL 2 MG/2ML IJ SOLN
INTRAMUSCULAR | Status: AC
  Administered 2020-08-21: 1 mg via INTRAVENOUS
  Filled 2020-08-21: qty 2

## 2020-08-21 MED ORDER — MIDAZOLAM HCL 2 MG/2ML IJ SOLN: 1.0000 mg | Freq: Once | INTRAMUSCULAR | Status: AC

## 2020-08-21 MED ORDER — FENTANYL CITRATE (PF) 100 MCG/2ML IJ SOLN: 50.0000 ug | Freq: Once | INTRAMUSCULAR | Status: AC

## 2020-08-21 MED ORDER — LIDOCAINE 2% (20 MG/ML) 5 ML SYRINGE
INTRAMUSCULAR | Status: AC
  Filled 2020-08-21: qty 5

## 2020-08-21 MED ORDER — ONDANSETRON HCL 4 MG/2ML IJ SOLN: 4.0000 mg | Freq: Once | INTRAMUSCULAR | Status: DC | PRN

## 2020-08-21 MED ORDER — ROPIVACAINE HCL 7.5 MG/ML IJ SOLN
INTRAMUSCULAR | Status: DC | PRN
  Administered 2020-08-21: 30 mL via PERINEURAL

## 2020-08-21 MED ORDER — EPHEDRINE SULFATE-NACL 50-0.9 MG/10ML-% IV SOSY
PREFILLED_SYRINGE | INTRAVENOUS | Status: DC | PRN
  Administered 2020-08-21: 10 mg via INTRAVENOUS
  Administered 2020-08-21 (×2): 5 mg via INTRAVENOUS
  Administered 2020-08-21: 10 mg via INTRAVENOUS
  Administered 2020-08-21 (×2): 5 mg via INTRAVENOUS

## 2020-08-21 MED ORDER — CHLORHEXIDINE GLUCONATE 0.12 % MT SOLN
OROMUCOSAL | Status: AC
  Administered 2020-08-21: 15 mL via OROMUCOSAL
  Filled 2020-08-21: qty 15

## 2020-08-21 MED ORDER — 0.9 % SODIUM CHLORIDE (POUR BTL) OPTIME
TOPICAL | Status: DC | PRN
  Administered 2020-08-21: 1000 mL

## 2020-08-21 MED ORDER — STERILE WATER FOR IRRIGATION IR SOLN
Status: DC | PRN
  Administered 2020-08-21: 1000 mL

## 2020-08-21 MED ORDER — OXYCODONE HCL 5 MG PO TABS
ORAL_TABLET | ORAL | Status: DC
Start: 2020-08-21 — End: 2020-08-21
  Filled 2020-08-21: qty 1

## 2020-08-21 MED ORDER — ENSURE PRE-SURGERY PO LIQD: 296.0000 mL | Freq: Once | ORAL | Status: DC

## 2020-08-21 MED ORDER — ACETAMINOPHEN 325 MG PO TABS
650.0000 mg | ORAL_TABLET | ORAL | Status: DC | PRN
  Filled 2020-08-21: qty 2

## 2020-08-21 MED ORDER — BUPIVACAINE HCL (PF) 0.25 % IJ SOLN
INTRAMUSCULAR | Status: DC | PRN
  Administered 2020-08-21: 6 mL

## 2020-08-21 MED ORDER — TRAMADOL HCL 50 MG PO TABS: 100.0000 mg | ORAL_TABLET | Freq: Four times a day (QID) | ORAL | 0 refills | Status: DC | PRN

## 2020-08-21 MED ORDER — PROPOFOL 10 MG/ML IV BOLUS
INTRAVENOUS | Status: AC
  Filled 2020-08-21: qty 20

## 2020-08-21 MED ORDER — ACETAMINOPHEN 500 MG PO TABS
ORAL_TABLET | ORAL | Status: AC
  Administered 2020-08-21: 1000 mg via ORAL
  Filled 2020-08-21: qty 2

## 2020-08-21 MED ORDER — ACETAMINOPHEN 650 MG RE SUPP
650.0000 mg | RECTAL | Status: DC | PRN
  Filled 2020-08-21: qty 1

## 2020-08-21 MED ORDER — PROPOFOL 10 MG/ML IV BOLUS
INTRAVENOUS | Status: DC | PRN
  Administered 2020-08-21: 100 mg via INTRAVENOUS
  Administered 2020-08-21: 70 mg via INTRAVENOUS

## 2020-08-21 MED ORDER — FENTANYL CITRATE (PF) 250 MCG/5ML IJ SOLN
INTRAMUSCULAR | Status: AC
Start: 2020-08-21 — End: ?
  Filled 2020-08-21: qty 5

## 2020-08-21 MED ORDER — MEPERIDINE HCL 25 MG/ML IJ SOLN: 6.2500 mg | INTRAMUSCULAR | Status: DC | PRN

## 2020-08-21 MED ORDER — ACETAMINOPHEN 160 MG/5ML PO SOLN: 325.0000 mg | ORAL | Status: DC | PRN

## 2020-08-21 MED ORDER — GABAPENTIN 100 MG PO CAPS: 100.0000 mg | ORAL_CAPSULE | ORAL | Status: AC

## 2020-08-21 MED ORDER — FENTANYL CITRATE (PF) 250 MCG/5ML IJ SOLN
INTRAMUSCULAR | Status: AC
  Filled 2020-08-21: qty 5

## 2020-08-21 MED ORDER — METHYLENE BLUE 0.5 % INJ SOLN
INTRAVENOUS | Status: AC
  Filled 2020-08-21: qty 10

## 2020-08-21 MED ORDER — ORAL CARE MOUTH RINSE: 15.0000 mL | Freq: Once | OROMUCOSAL | Status: AC

## 2020-08-21 MED ORDER — CHLORHEXIDINE GLUCONATE 0.12 % MT SOLN: 15.0000 mL | Freq: Once | OROMUCOSAL | Status: AC

## 2020-08-21 MED ORDER — DEXAMETHASONE SODIUM PHOSPHATE 10 MG/ML IJ SOLN
INTRAMUSCULAR | Status: DC | PRN
  Administered 2020-08-21: 5 mg via INTRAVENOUS

## 2020-08-21 MED ORDER — ONDANSETRON HCL 4 MG/2ML IJ SOLN
INTRAMUSCULAR | Status: AC
  Filled 2020-08-21: qty 2

## 2020-08-21 MED ORDER — LACTATED RINGERS IV SOLN: INTRAVENOUS | Status: DC

## 2020-08-21 MED ORDER — ACETAMINOPHEN 325 MG PO TABS: 325.0000 mg | ORAL_TABLET | ORAL | Status: DC | PRN

## 2020-08-21 MED ORDER — MIDAZOLAM HCL 2 MG/2ML IJ SOLN
INTRAMUSCULAR | Status: AC
  Filled 2020-08-21: qty 2

## 2020-08-21 MED ORDER — OXYCODONE HCL 5 MG/5ML PO SOLN: 5.0000 mg | Freq: Once | ORAL | Status: AC | PRN

## 2020-08-21 MED ORDER — FENTANYL CITRATE (PF) 100 MCG/2ML IJ SOLN
INTRAMUSCULAR | Status: AC
Start: 2020-08-21 — End: 2020-08-21
  Administered 2020-08-21: 50 ug via INTRAVENOUS
  Filled 2020-08-21: qty 2

## 2020-08-21 MED ORDER — SODIUM CHLORIDE 0.9 % IV SOLN: 250.0000 mL | INTRAVENOUS | Status: DC | PRN

## 2020-08-21 MED ORDER — CEFAZOLIN SODIUM-DEXTROSE 2-4 GM/100ML-% IV SOLN
INTRAVENOUS | Status: AC
  Filled 2020-08-21: qty 100

## 2020-08-21 MED ORDER — GABAPENTIN 100 MG PO CAPS
ORAL_CAPSULE | ORAL | Status: AC
  Administered 2020-08-21: 100 mg via ORAL
  Filled 2020-08-21: qty 1

## 2020-08-21 MED ORDER — ACETAMINOPHEN 500 MG PO TABS: 1000.0000 mg | ORAL_TABLET | ORAL | Status: AC

## 2020-08-21 MED ORDER — OXYCODONE HCL 5 MG PO TABS: 5.0000 mg | ORAL_TABLET | ORAL | Status: DC | PRN

## 2020-08-21 MED ORDER — OXYCODONE HCL 5 MG PO TABS
5.0000 mg | ORAL_TABLET | Freq: Once | ORAL | Status: AC | PRN
  Administered 2020-08-21: 5 mg via ORAL

## 2020-08-21 MED ORDER — ONDANSETRON HCL 4 MG/2ML IJ SOLN
INTRAMUSCULAR | Status: DC | PRN
  Administered 2020-08-21: 4 mg via INTRAVENOUS

## 2020-08-21 MED ORDER — SODIUM CHLORIDE 0.9 % IV SOLN: INTRAVENOUS | Status: DC

## 2020-08-21 MED ORDER — BUPIVACAINE HCL (PF) 0.25 % IJ SOLN
INTRAMUSCULAR | Status: AC
  Filled 2020-08-21: qty 30

## 2020-08-21 SURGICAL SUPPLY — 77 items
ADH SKN CLS APL DERMABOND .7 (GAUZE/BANDAGES/DRESSINGS) ×1
APL PRP STRL LF DISP 70% ISPRP (MISCELLANEOUS) ×1
APPLIER CLIP 9.375 MED OPEN (MISCELLANEOUS)
APR CLP MED 9.3 20 MLT OPN (MISCELLANEOUS)
BINDER BREAST LRG (GAUZE/BANDAGES/DRESSINGS) ×2 IMPLANT
BINDER BREAST MEDIUM (GAUZE/BANDAGES/DRESSINGS) IMPLANT
BINDER BREAST XLRG (GAUZE/BANDAGES/DRESSINGS) IMPLANT
BINDER BREAST XXLRG (GAUZE/BANDAGES/DRESSINGS) IMPLANT
BLADE SURG 15 STRL LF DISP TIS (BLADE) ×1 IMPLANT
BLADE SURG 15 STRL SS (BLADE) ×3
CANISTER SUC SOCK COL 7IN (MISCELLANEOUS) IMPLANT
CANISTER SUCT 1200ML W/VALVE (MISCELLANEOUS) IMPLANT
CANISTER SUCT 3000ML PPV (MISCELLANEOUS) ×1 IMPLANT
CHLORAPREP W/TINT 26 (MISCELLANEOUS) ×4 IMPLANT
CLIP APPLIE 9.375 MED OPEN (MISCELLANEOUS) IMPLANT
CLIP VESOCCLUDE MED 6/CT (CLIP) ×1 IMPLANT
CLIP VESOCCLUDE SM WIDE 6/CT (CLIP) ×3 IMPLANT
CLOSURE WOUND 1/2 X4 (GAUZE/BANDAGES/DRESSINGS) ×1
COVER BACK TABLE 60X90IN (DRAPES) ×1 IMPLANT
COVER MAYO STAND STRL (DRAPES) ×1 IMPLANT
COVER PROBE W GEL 5X96 (DRAPES) ×4 IMPLANT
COVER SURGICAL LIGHT HANDLE (MISCELLANEOUS) ×3 IMPLANT
COVER WAND RF STERILE (DRAPES) ×1 IMPLANT
DECANTER SPIKE VIAL GLASS SM (MISCELLANEOUS) ×2 IMPLANT
DERMABOND ADVANCED (GAUZE/BANDAGES/DRESSINGS) ×2
DERMABOND ADVANCED .7 DNX12 (GAUZE/BANDAGES/DRESSINGS) ×2 IMPLANT
DEVICE DUBIN SPECIMEN MAMMOGRA (MISCELLANEOUS) ×3 IMPLANT
DRAPE CHEST BREAST 15X10 FENES (DRAPES) ×3 IMPLANT
DRAPE LAPAROSCOPIC ABDOMINAL (DRAPES) ×3 IMPLANT
DRAPE UTILITY XL STRL (DRAPES) ×3 IMPLANT
ELECT COATED BLADE 2.86 ST (ELECTRODE) ×4 IMPLANT
ELECT REM PT RETURN 9FT ADLT (ELECTROSURGICAL) ×3
ELECTRODE REM PT RTRN 9FT ADLT (ELECTROSURGICAL) ×2 IMPLANT
GLOVE BIO SURGEON STRL SZ7 (GLOVE) ×9 IMPLANT
GLOVE BIOGEL PI IND STRL 7.5 (GLOVE) ×2 IMPLANT
GLOVE BIOGEL PI INDICATOR 7.5 (GLOVE) ×4
GOWN STRL REUS W/ TWL LRG LVL3 (GOWN DISPOSABLE) ×4 IMPLANT
GOWN STRL REUS W/TWL LRG LVL3 (GOWN DISPOSABLE) ×12
HEMOSTAT ARISTA ABSORB 3G PWDR (HEMOSTASIS) IMPLANT
ILLUMINATOR WAVEGUIDE N/F (MISCELLANEOUS) IMPLANT
KIT BASIN OR (CUSTOM PROCEDURE TRAY) ×3 IMPLANT
KIT MARKER MARGIN INK (KITS) ×4 IMPLANT
NDL 18GX1X1/2 (RX/OR ONLY) (NEEDLE) IMPLANT
NDL FILTER BLUNT 18X1 1/2 (NEEDLE) IMPLANT
NDL HYPO 25GX1X1/2 BEV (NEEDLE) ×1 IMPLANT
NDL HYPO 25X1 1.5 SAFETY (NEEDLE) ×1 IMPLANT
NDL SAFETY ECLIPSE 18X1.5 (NEEDLE) IMPLANT
NEEDLE 18GX1X1/2 (RX/OR ONLY) (NEEDLE) IMPLANT
NEEDLE FILTER BLUNT 18X 1/2SAF (NEEDLE)
NEEDLE FILTER BLUNT 18X1 1/2 (NEEDLE) IMPLANT
NEEDLE HYPO 18GX1.5 SHARP (NEEDLE)
NEEDLE HYPO 25GX1X1/2 BEV (NEEDLE) ×3 IMPLANT
NEEDLE HYPO 25X1 1.5 SAFETY (NEEDLE) IMPLANT
NS IRRIG 1000ML POUR BTL (IV SOLUTION) ×3 IMPLANT
PACK BASIN DAY SURGERY FS (CUSTOM PROCEDURE TRAY) ×1 IMPLANT
PACK GENERAL/GYN (CUSTOM PROCEDURE TRAY) ×3 IMPLANT
PENCIL SMOKE EVACUATOR (MISCELLANEOUS) ×3 IMPLANT
RETRACTOR ONETRAX LX 90X20 (MISCELLANEOUS) ×2 IMPLANT
SLEEVE SCD COMPRESS KNEE MED (MISCELLANEOUS) ×1 IMPLANT
SPONGE LAP 4X18 RFD (DISPOSABLE) ×3 IMPLANT
STRIP CLOSURE SKIN 1/2X4 (GAUZE/BANDAGES/DRESSINGS) ×3 IMPLANT
SUT ETHILON 2 0 FS 18 (SUTURE) IMPLANT
SUT MNCRL AB 4-0 PS2 18 (SUTURE) ×7 IMPLANT
SUT MON AB 5-0 PS2 18 (SUTURE) IMPLANT
SUT SILK 2 0 SH (SUTURE) ×4 IMPLANT
SUT VIC AB 2-0 SH 27 (SUTURE) ×9
SUT VIC AB 2-0 SH 27XBRD (SUTURE) ×3 IMPLANT
SUT VIC AB 3-0 SH 27 (SUTURE) ×6
SUT VIC AB 3-0 SH 27X BRD (SUTURE) ×3 IMPLANT
SUT VIC AB 5-0 PS2 18 (SUTURE) IMPLANT
SYR CONTROL 10ML LL (SYRINGE) ×4 IMPLANT
TOWEL GREEN STERILE (TOWEL DISPOSABLE) ×3 IMPLANT
TOWEL GREEN STERILE FF (TOWEL DISPOSABLE) ×2 IMPLANT
TRAY FAXITRON CT DISP (TRAY / TRAY PROCEDURE) ×1 IMPLANT
TUBE CONNECTING 20'X1/4 (TUBING)
TUBE CONNECTING 20X1/4 (TUBING) IMPLANT
YANKAUER SUCT BULB TIP NO VENT (SUCTIONS) IMPLANT

## 2020-08-21 NOTE — Anesthesia Procedure Notes (Signed)
Anesthesia Regional Block: Pectoralis block   Pre-Anesthetic Checklist: ,, timeout performed, Correct Patient, Correct Site, Correct Laterality, Correct Procedure, Correct Position, site marked, Risks and benefits discussed,  Surgical consent,  Pre-op evaluation,  At surgeon's request and post-op pain management  Laterality: Right  Prep: chloraprep       Needles:  Injection technique: Single-shot  Needle Type: Echogenic Stimulator Needle     Needle Length: 5cm  Needle Gauge: 22     Additional Needles:   Procedures:, nerve stimulator,,, ultrasound used (permanent image in chart),,,,  Narrative:  Start time: 08/21/2020 9:00 AM End time: 08/21/2020 9:05 AM Injection made incrementally with aspirations every 5 mL.  Performed by: Personally  Anesthesiologist: Janeece Riggers, MD  Additional Notes: Functioning IV was confirmed and monitors were applied.  A 17mm 22ga Arrow echogenic stimulator needle was used. Sterile prep and drape,hand hygiene and sterile gloves were used. Ultrasound guidance: relevant anatomy identified, needle position confirmed, local anesthetic spread visualized around nerve(s)., vascular puncture avoided.  Image printed for medical record. Negative aspiration and negative test dose prior to incremental administration of local anesthetic. The patient tolerated the procedure well.

## 2020-08-21 NOTE — Anesthesia Postprocedure Evaluation (Signed)
Anesthesia Post Note  Patient: Jean Schwartz  Procedure(s) Performed: RIGHT BREAST LUMPECTOMY WITH RADIOACTIVE SEED AND RIGHT AXILLARY SENTINEL LYMPH NODE BIOPSY (Right Breast)     Patient location during evaluation: PACU Anesthesia Type: General Level of consciousness: awake and alert Pain management: pain level controlled Vital Signs Assessment: post-procedure vital signs reviewed and stable Respiratory status: spontaneous breathing, nonlabored ventilation, respiratory function stable and patient connected to nasal cannula oxygen Cardiovascular status: blood pressure returned to baseline and stable Postop Assessment: no apparent nausea or vomiting Anesthetic complications: no   No complications documented.  Last Vitals:  Vitals:   08/21/20 1100 08/21/20 1104  BP:  (!) 158/58  Pulse: 61 63  Resp: 12 13  Temp:    SpO2: 100% 99%    Last Pain:  Vitals:   08/21/20 1049  TempSrc:   PainSc: 4                  Danelly Hassinger

## 2020-08-21 NOTE — Interval H&P Note (Signed)
History and Physical Interval Note:  08/21/2020 9:00 AM  Jean Schwartz  has presented today for surgery, with the diagnosis of RIGHT BREAST CANCER.  The various methods of treatment have been discussed with the patient and family. After consideration of risks, benefits and other options for treatment, the patient has consented to  Procedure(s) with comments: RIGHT BREAST LUMPECTOMY WITH RADIOACTIVE SEED AND RIGHT AXILLARY SENTINEL LYMPH NODE BIOPSY (Right) - PEC BLOCK as a surgical intervention.  The patient's history has been reviewed, patient examined, no change in status, stable for surgery.  I have reviewed the patient's chart and labs.  Questions were answered to the patient's satisfaction.     Rolm Bookbinder

## 2020-08-21 NOTE — H&P (Signed)
74 yof here with new right breast cancer. she has no fh or prior breast history. has no mass or dc. had screening mm that shows c density breasts. there was irregular mass in ruoq. US showed a 1x1x0.9 cm mass, US of the axilla was negative. biopsy was done that shows grade II [facility] that is er/pr pos and her 2 negative. she is here to discuss options. she is overall very healthy, mom lived to almost 100.    Past Surgical History Illene Regulus, CMA; 08/06/2020 10:23 AM) No pertinent past surgical history   Diagnostic Studies History Illene Regulus, CMA; 08/06/2020 10:23 AM) Mammogram  1-3 years ago  Allergies Lars Mage Spillers, CMA; 08/06/2020 10:23 AM) No Known Drug Allergies  [08/06/2020]: Allergies Reconciled   Medication History (Alisha Spillers, CMA; 08/06/2020 10:24 AM) Fluticasone Propionate (50MCG/ACT Suspension, Nasal) Active. Medications Reconciled  Social History Illene Regulus, CMA; 08/06/2020 10:23 AM) No alcohol use  No drug use  Tobacco use  Never smoker.  Family History Illene Regulus, [place]; 08/06/2020 10:23 AM) Arthritis  Sister. Diabetes Mellitus  Sister. Hypertension  Brother, Sister, Son. Migraine Headache  Son. Thyroid problems  Sister.  Pregnancy / Birth History Illene Regulus, CMA; 08/06/2020 10:23 AM) Age at menarche  27 years. Maternal age  26-20 Para  2  Other Problems Illene Regulus, CMA; 08/06/2020 10:23 AM) Gastroesophageal Reflux Disease     Review of Systems Lars Mage Spillers CMA; 08/06/2020 10:23 AM) General Present- Fatigue. Not Present- Appetite Loss, Chills, Fever, Night Sweats, Weight Gain and Weight Loss. HEENT Present- Ringing in the Ears, Seasonal Allergies and Wears glasses/contact lenses. Not Present- Earache, Hearing Loss, Hoarseness, Nose Bleed, Oral Ulcers, Sinus Pain, Sore Throat, Visual Disturbances and Yellow Eyes. Respiratory Not Present- Bloody sputum, Chronic Cough, Difficulty Breathing, Snoring and  Wheezing. Breast Present- Breast Mass. Not Present- Breast Pain, Nipple Discharge and Skin Changes. Cardiovascular Not Present- Chest Pain, Difficulty Breathing Lying Down, Leg Cramps, Palpitations, Rapid Heart Rate, Shortness of Breath and Swelling of Extremities. Gastrointestinal Not Present- Abdominal Pain, Bloating, Bloody Stool, Change in Bowel Habits, Chronic diarrhea, Constipation, Difficulty Swallowing, Excessive gas, Gets full quickly at meals, Hemorrhoids, Indigestion, Nausea, Rectal Pain and Vomiting. Female Genitourinary Not Present- Frequency, Nocturia, Painful Urination, Pelvic Pain and Urgency. Musculoskeletal Not Present- Back Pain, Joint Pain, Joint Stiffness, Muscle Pain, Muscle Weakness and Swelling of Extremities. Neurological Not Present- Decreased Memory, Fainting, Headaches, Numbness, Seizures, Tingling, Tremor, Trouble walking and Weakness. Psychiatric Not Present- Anxiety, Bipolar, Change in Sleep Pattern, Depression, Fearful and Frequent crying. Endocrine Not Present- Cold Intolerance, Excessive Hunger, Hair Changes, Heat Intolerance, Hot flashes and New Diabetes. Hematology Not Present- Blood Thinners, Easy Bruising, Excessive bleeding, Gland problems, HIV and Persistent Infections.  Vitals (Alisha Spillers CMA; 08/06/2020 10:23 AM) 08/06/2020 10:23 AM Weight: 127.2 lb Height: 62in Body Surface Area: 1.58 m Body Mass Index: 23.26 kg/m  Temp.: 97.30F (Oral)  BP: 124/84(Sitting, Left Arm, Standard)       Physical Exam Rolm Bookbinder MD; 08/06/2020 12:14 PM) General Mental Status-Alert. Orientation-Oriented X3.  Breast Nipples-No Discharge. Breast - Bilateral-Non Tender. Breast Lump-No Palpable Breast Mass.  Lymphatic Head & Neck  General Head & Neck Lymphatics: Bilateral - Description - Normal. Axillary  General Axillary Region: Bilateral - Description - Normal. 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 We discussed all of the different options for treatment for breast cancer including surgery, chemotherapy, radiation therapy, Herceptin, and antiestrogen therapy. We discussed a sentinel lymph node biopsy as she does not appear to having lymph node involvement right now. We discussed the performance of that with injection of radioactive tracer. We discussed that there is a chance of having a positive node with a sentinel lymph node biopsy and we will await the permanent pathology to make any other first further decisions in terms of her treatment. We discussed up to a 5% risk lifetime of chronic shoulder pain as well as lymphedema associated with a sentinel lymph node biopsy. I do think reasonable given grade II 1 cm and her overall health to do a sn and we discussed that today We discussed the options for treatment of the breast cancer which included lumpectomy versus a mastectomy. We discussed the performance of the lumpectomy with radioactive seed placement. We discussed a 5-10% chance of a positive margin requiring reexcision in the operating room. We also discussed that she might need radiation therapy if she undergoes lumpectomy. We discussed mastectomy and the postoperative care for that as well. The decision for lumpectomy vs mastectomy has no impact on decision for chemotherapy. Most mastectomy patients will not need radiation therapy. We discussed that there is no difference in her survival whether she undergoes lumpectomy with radiation therapy or antiestrogen therapy versus a mastectomy. There is also no real difference between her recurrence in the breast. We discussed the risks of operation including bleeding, infection, possible reoperation. She understands her further  therapy will be based on what her stages at the time of her operation.

## 2020-08-21 NOTE — Anesthesia Procedure Notes (Signed)
Procedure Name: LMA Insertion Date/Time: 08/21/2020 9:35 AM Performed by: Janace Litten, CRNA Pre-anesthesia Checklist: Patient identified, Emergency Drugs available, Suction available and Patient being monitored Patient Re-evaluated:Patient Re-evaluated prior to induction Oxygen Delivery Method: Circle System Utilized Preoxygenation: Pre-oxygenation with 100% oxygen Induction Type: IV induction Ventilation: Mask ventilation without difficulty LMA: LMA inserted LMA Size: 3.0 Number of attempts: 1 Airway Equipment and Method: Bite block Placement Confirmation: positive ETCO2 Tube secured with: Tape Dental Injury: Teeth and Oropharynx as per pre-operative assessment

## 2020-08-21 NOTE — Transfer of Care (Signed)
Immediate Anesthesia Transfer of Care Note  Patient: Jean Schwartz  Procedure(s) Performed: RIGHT BREAST LUMPECTOMY WITH RADIOACTIVE SEED AND RIGHT AXILLARY SENTINEL LYMPH NODE BIOPSY (Right Breast)  Patient Location: PACU  Anesthesia Type:General  Level of Consciousness: awake, alert  and patient cooperative  Airway & Oxygen Therapy: Patient Spontanous Breathing  Post-op Assessment: Report given to RN and Post -op Vital signs reviewed and stable  Post vital signs: Reviewed and stable  Last Vitals:  Vitals Value Taken Time  BP    Temp    Pulse 66 08/21/20 1049  Resp 12 08/21/20 1049  SpO2 100 % 08/21/20 1049  Vitals shown include unvalidated device data.  Last Pain:  Vitals:   08/21/20 0915  TempSrc:   PainSc: 0-No pain         Complications: No complications documented.

## 2020-08-21 NOTE — Op Note (Signed)
Preoperative diagnosis: Clinical stage I right breast cancer Postoperative diagnosis: Same as above Procedure: 1.  Right breast radioactive seed guided lumpectomy 2.  Right deep axillary sentinel lymph node biopsy Surgeon: Dr. Serita Grammes Anesthesia: General with a pectoral block Estimated blood loss: Minimal Complications: None Drains: None Specimens: 1.  Right breast tissue containing seed and clip 2.  Right deep axillary sentinel lymph nodes with highest count of 253 3.  Additional margins superior, anterior, posterior, and medial margins marked short superior, long lateral double deep Sponge needle count was correct completion Disposition to recovery stable condition  Indications: 54 yof here with new right breast cancer. she has no fh or prior breast history. has no mass or dc. had screening mm that shows c density breasts. there was irregular mass in ruoq. US showed a 1x1x0.9 cm mass, US of the axilla was negative. biopsy was done that shows grade II [facility] that is er/pr pos and her 2 negative. we discussed lumpectomy and sn biopsy.   Procedure: After informed consent was obtained the patient was given antibiotics. She underwent a pec block.  She was given technetium in the standard periareolar fashion.  She had SCDs in place.  She was then placed under general anesthesia without complication.  Her right side was then prepped and draped in the standard sterile surgical fashion.  A surgical timeout was then performed.  I did the breast cancer first.  I identified the radioactive seed in the lower outer quadrant.  I infiltrated Marcaine and made an incision overlying the seed.   I then dissected down to the seed.  I then removed the seed and the surrounding tissue with an attempt to get a clear margin.  I then passed this off the table.  Mammogram confirmed removal of the seed and the clip.  I did remove some additional margins and the posterior margin is the muscle now.   I then  obtained hemostasis.  Clips were placed in the cavity.  I then closed the breast tissue with 2-0 Vicryl.  The skin was closed with 3-0 Vicryl and the 4-0 Monocryl for the last layer.  Steri-Strips and glue were eventually applied to this.  I then made a incision below the axillary hairline.  I carried this through the axillary fascia.  I was able to identify what appeared to be several small lymph nodes.I removed the radioactive tissue.    There was no background radioactivity.  Hemostasis was obtained.  I closed the axillary fascia with 2-0 Vicryl.  The skin was closed with 3-0 Vicryl and 4-0 Monocryl.  Steri-Strips and dry were placed. Binder was placed.   She was then awakened and transferred to recovery in stable condition.

## 2020-08-21 NOTE — Discharge Instructions (Signed)
[facility] Office Phone Number [phone]  BREAST BIOPSY/ PARTIAL MASTECTOMY: POST OP INSTRUCTIONS Take 400 mg of ibuprofen every 8 hours or 650 mg tylenol every 6 hours for next 72 hours then as needed. Use ice several times daily also. Always review your discharge instruction sheet given to you by the facility where your surgery was performed.  IF YOU HAVE DISABILITY OR FAMILY LEAVE FORMS, YOU MUST BRING THEM TO THE OFFICE FOR PROCESSING.  DO NOT GIVE THEM TO YOUR DOCTOR.  1. A prescription for pain medication may be given to you upon discharge.  Take your pain medication as prescribed, if needed.  If narcotic pain medicine is not needed, then you may take acetaminophen (Tylenol), naprosyn (Alleve) or ibuprofen (Advil) as needed. 2. Take your usually prescribed medications unless otherwise directed 3. If you need a refill on your pain medication, please contact your pharmacy.  They will contact our office to request authorization.  Prescriptions will not be filled after 5pm or on week-ends. 4. You should eat very light the first 24 hours after surgery, such as soup, crackers, pudding, etc.  Resume your normal diet the day after surgery. 5. Most patients will experience some swelling and bruising in the breast.  Ice packs and a good support bra will help.  Wear the breast binder provided or a sports bra for 72 hours day and night.  After that wear a sports bra during the day until you return to the office. Swelling and bruising can take several days to resolve.  6. It is common to experience some constipation if taking pain medication after surgery.  Increasing fluid intake and taking a stool softener will usually help or prevent this problem from occurring.  A mild laxative (Milk of Magnesia or Miralax) should be taken according to package directions if there are no bowel movements after 48 hours. 7. Unless discharge instructions indicate otherwise, you may remove your bandages 48  hours after surgery and you may shower at that time.  You may have steri-strips (small skin tapes) in place directly over the incision.  These strips should be left on the skin for 7-10 days and will come off on their own.  If your surgeon used skin glue on the incision, you may shower in 24 hours.  The glue will flake off over the next 2-3 weeks.  Any sutures or staples will be removed at the office during your follow-up visit. 8. ACTIVITIES:  You may resume regular daily activities (gradually increasing) beginning the next day.  Wearing a good support bra or sports bra minimizes pain and swelling.  You may have sexual intercourse when it is comfortable. a. You may drive when you no longer are taking prescription pain medication, you can comfortably wear a seatbelt, and you can safely maneuver your car and apply brakes. b. RETURN TO WORK:  ______________________________________________________________________________________ 9. You should see your doctor in the office for a follow-up appointment approximately two weeks after your surgery.  Your doctor's nurse will typically make your follow-up appointment when she calls you with your pathology report.  Expect your pathology report 3-4 business days after your surgery.  You may call to check if you do not hear from us after three days. 10. OTHER INSTRUCTIONS: _______________________________________________________________________________________________ _____________________________________________________________________________________________________________________________________ _____________________________________________________________________________________________________________________________________ _____________________________________________________________________________________________________________________________________  WHEN TO CALL DR Araminta Zorn: 1. Fever over 101.0 2. Nausea and/or vomiting. 3. Extreme swelling or  bruising. 4. Continued bleeding from incision. 5. Increased pain, redness, or drainage from the incision.  The clinic   staff is available to answer your questions during regular business hours.  Please don't hesitate to call and ask to speak to one of the nurses for clinical concerns.  If you have a medical emergency, go to the nearest emergency room or call 911.  A surgeon from [facility] is always on call at the hospital.  For further questions, please visit [email or web address]  

## 2020-08-22 ENCOUNTER — Encounter (HOSPITAL_COMMUNITY): Payer: Self-pay | Admitting: General Surgery

## 2020-08-24 LAB — SURGICAL PATHOLOGY

## 2020-08-26 ENCOUNTER — Encounter: Payer: Self-pay | Admitting: *Deleted

## 2020-08-26 ENCOUNTER — Telehealth: Payer: Self-pay | Admitting: *Deleted

## 2020-08-26 NOTE — Telephone Encounter (Signed)
Received order for oncotype testing. Requisition faxed to pathology and [facility] °

## 2020-09-02 ENCOUNTER — Telehealth: Payer: Self-pay | Admitting: *Deleted

## 2020-09-02 DIAGNOSIS — C50511 Malignant neoplasm of lower-outer quadrant of right female breast: Secondary | ICD-10-CM | POA: Diagnosis not present

## 2020-09-02 DIAGNOSIS — Z170 Estrogen receptor positive status [ER+]: Secondary | ICD-10-CM | POA: Diagnosis not present

## 2020-09-02 NOTE — Telephone Encounter (Signed)
Received oncotype score of 10. Physician team notified. Called pt and left vm with results and that chemo is not recommended. Request return call to discuss location of xrt. Contact information provided.

## 2020-09-03 ENCOUNTER — Telehealth: Payer: Self-pay | Admitting: *Deleted

## 2020-09-03 DIAGNOSIS — C50511 Malignant neoplasm of lower-outer quadrant of right female breast: Secondary | ICD-10-CM

## 2020-09-03 NOTE — Telephone Encounter (Signed)
Spoke to pt concerning Oncotype score of 10. Discussed chemo not recommended. Referral placed for pt to see Dr. Isidore Moos.  Physician team notified.

## 2020-09-07 ENCOUNTER — Encounter: Payer: Self-pay | Admitting: *Deleted

## 2020-09-07 NOTE — Progress Notes (Signed)
Radiation Oncology         [phone] ________________________________  Initial Outpatient Consultation by telephone.  The patient opted for telemedicine to maximize safety during the pandemic.  MyChart video was not obtainable.   Name: Jean Schwartz MRN: [id number]  Date: 09/08/2020  DOB: May 08, 1946  NW:[id number], Leonie Douglas, MD  Truitt Merle, MD   REFERRING PHYSICIAN: Truitt Merle, MD  DIAGNOSIS:    ICD-10-CM   1. Malignant neoplasm of lower-outer quadrant of right breast of female, estrogen receptor positive ([place])  C50.511    Z17.0    Cancer Staging Malignant neoplasm of lower-outer quadrant of right breast of female, estrogen receptor positive ([place]) Staging form: Breast, AJCC 8th Edition - Clinical stage from 07/23/2020: Stage IA (cT1b, cN0, cM0, G2, ER+, PR+, HER2-) - Signed by Truitt Merle, MD on 08/10/2020   Stage IA (pT1c, pN0) Right Breast LOQ, Invasive Mammary Carcinoma, ER+ / PR+ / Her2-, Grade 2  CHIEF COMPLAINT: Here to discuss management of right breast cancer  HISTORY OF PRESENT ILLNESS::Jean Schwartz is a 74 y.o. female who presented with right breast abnormality on the following imaging: bilateral screening mammogram on the date of 06/29/2020. No symptoms were reported at that time. Ultrasound of breast on 07/14/2020 revealed a 1.0 cm mass in the 8 o'clock position of the right breast with imaging features that were suspicious for malignancy. Biopsy on the date of 07/23/2020 showed 11 mm of invasive mammary carcinoma, no special type. ER status: 91-100% strong; PR status: 51-90% moderate, Her2 status: negative; Grade 2.  Given the above findings, the patient was referred to Dr. Burr Medico and was seen in consultation on 08/10/2020. At that time, it was recommended that she proceed with lumpectomy. If surgical tumor was more than 1 cm, then Oncotype Dx was recommended to determine the significance of adjuvant chemotherapy.   The patient underwent a right lumpectomy with   sentinel lymph node biopsy on the date of 08/21/2020. 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 The invasive ductal carcinoma was less than 1 mm from the lateral margin broadly. Additional margins were all negative for carcinoma. Margins were less than 90m lateral, broadly from in-situ carcinoma. One right axillary sentinel lymph node was excised and was negative for carcinoma.   Oncotype DX was obtained on the final surgical sample and the recurrence score of 10 predicted a low risk of recurrence outside the breast over the next 9 years if the patient's only systemic therapy was an antiestrogen for 5 years. Therefore, chemotherapy was not recommended.  The patient reports that she does not tend to respond well to medications.  She is strongly considering antiestrogen therapy with Dr. FBurr Medico  DEXA scan has not yet been performed but was recommended before starting antiestrogen therapy.  [place]  She has not yet been vaccinated for Covid.  She reports being in excellent health.  PREVIOUS RADIATION THERAPY: No  PAST MEDICAL HISTORY:  has a past medical history of Allergy, Arthritis, Breast cancer [place], Cataract, Complication of anesthesia, GERD (gastroesophageal reflux disease), and Osteoporosis.    PAST SURGICAL HISTORY: Past Surgical History:  Procedure Laterality Date  . BREAST BIOPSY Right 07/23/2020   [place], Vision clip, path pending   . BREAST LUMPECTOMY WITH RADIOACTIVE SEED AND SENTINEL LYMPH NODE BIOPSY Right 08/21/2020   Procedure: RIGHT BREAST  LUMPECTOMY WITH RADIOACTIVE SEED AND RIGHT AXILLARY SENTINEL LYMPH NODE BIOPSY;  Surgeon: Rolm Bookbinder, MD;  Location: [facility];  Service: General;  Laterality: Right;  PEC BLOCK  . CATARACT EXTRACTION W/ INTRAOCULAR LENS  IMPLANT, BILATERAL Bilateral    . COLONOSCOPY    . TUBAL LIGATION      FAMILY HISTORY: family history includes Cancer in her mother; Cancer (age of onset: 24) in her niece.  SOCIAL HISTORY:  reports that she has never smoked. She has never used smokeless tobacco. She reports that she does not drink alcohol and does not use drugs.  ALLERGIES: Patient has no known allergies.  MEDICATIONS:  Current Outpatient Medications  Medication Sig Dispense Refill  . acetaminophen (TYLENOL) 500 MG tablet Take 500 mg by mouth every 6 (six) hours as needed.    . APPLE CIDER VINEGAR PO Take 2 tablets by mouth daily.    . Biotin w/ Vitamins C & E (HAIR/SKIN/NAILS PO) Take 1 tablet by mouth daily.    . Calcium Citrate (CITRACAL PO) Take 1 tablet by mouth daily.    . cholecalciferol (VITAMIN D) 1000 units tablet Take 1,000 Units daily by mouth.    . fluticasone (FLONASE) 50 MCG/ACT nasal spray Place 2 sprays into both nostrils daily as needed for allergies.     . Multiple Vitamin (MULTIVITAMIN) tablet Take 1 tablet daily by mouth.    . Omega-3 Fatty Acids (FISH OIL OMEGA-3) 1000 MG CAPS Take 1,000 mg by mouth daily.     . vitamin B-12 (CYANOCOBALAMIN) 1000 MCG tablet Take 1,000 mcg by mouth daily.    . traMADol (ULTRAM) 50 MG tablet Take 2 tablets (100 mg total) by mouth every 6 (six) hours as needed. (Patient not taking: Reported on 09/08/2020) 10 tablet 0   No current facility-administered medications for this encounter.    REVIEW OF SYSTEMS: As above   PHYSICAL EXAM:  vitals were not taken for this visit.   General: Alert and oriented, in no acute      LABORATORY DATA:  Lab Results  Component Value Date   WBC 4.9 08/10/2020   HGB 13.7 08/10/2020   HCT 40.9 08/10/2020   MCV 86.5 08/10/2020   PLT 173 08/10/2020   CMP     Component Value Date/Time   NA 142 08/10/2020 1544   K 4.8 08/10/2020 1544   CL 108 08/10/2020 1544   CO2 27 08/10/2020 1544   GLUCOSE 88 08/10/2020 1544   BUN 13 08/10/2020 1544   CREATININE 0.78  08/10/2020 1544   CALCIUM 9.7 08/10/2020 1544   PROT 6.7 08/10/2020 1544   ALBUMIN 4.1 08/10/2020 1544   AST 24 08/10/2020 1544   ALT 22 08/10/2020 1544   ALKPHOS 65 08/10/2020 1544   BILITOT 0.6 08/10/2020 1544   GFRNONAA >60 08/10/2020 1544   GFRAA >60 08/10/2020 1544         RADIOGRAPHY: NM Sentinel Node Inj-No Rpt (Breast)  Result Date: 08/21/2020 Sulfur colloid was injected by the nuclear medicine technologist for melanoma sentinel node.   MM Breast Surgical Specimen  Result Date: 08/21/2020 CLINICAL DATA:  Biopsy proven invasive mammary carcinoma of the right breast. Status post surgical excision. EXAM: SPECIMEN RADIOGRAPH OF THE RIGHT BREAST COMPARISON:  Previous exam(s). FINDINGS: Status post excision of the right breast. The radioactive seed and biopsy marker clip are present, completely intact, and were marked for pathology. IMPRESSION: Specimen radiograph of the right breast. Electronically Signed   By: Lillia Mountain M.D.   On:  08/21/2020 10:06   MM RT RADIOACTIVE SEED LOC MAMMO GUIDE  Result Date: 08/20/2020 CLINICAL DATA:  74 year old with a biopsy-proven invasive mammary carcinoma of no special type, grade 2, involving the LOWER OUTER QUADRANT of the RIGHT breast. Radioactive seed localization is performed in anticipation of lumpectomy which is scheduled for tomorrow. EXAM: MAMMOGRAPHIC GUIDED RADIOACTIVE SEED LOCALIZATION OF THE RIGHT BREAST COMPARISON:  Previous exam(s). FINDINGS: Patient presents for radioactive seed localization prior to RIGHT breast lumpectomy. I met with the patient and we discussed the procedure of seed localization including benefits and alternatives. We discussed the high likelihood of a successful procedure. We discussed the risks of the procedure including infection, bleeding, tissue injury and further surgery. We discussed the low dose of radioactivity involved in the procedure. Informed, written consent was given. The usual time-out protocol was  performed immediately prior to the procedure. Using mammographic guidance, sterile technique with chlorhexidine as skin antisepsis, 1% lidocaine as local anesthetic, an I-125 radioactive seed was used to localize the [facility] clip associated with the biopsy-proven [facility] using a lateral approach. The follow-up mammogram images confirm that the seed is appropriately positioned immediately adjacent to the clip/mass. The images are marked for Dr. Donne Hazel. Follow-up survey of the patient confirms the presence of the radioactive seed. Order number of I-125 seed: [id number] Total activity: 0.258 mCi Reference Date: 07/30/2020 The patient tolerated the procedure well and was released from the [facility]. She was given instructions regarding seed removal. IMPRESSION: Radioactive seed localization of the biopsy-proven IMC of no special type involving the LOWER OUTER QUADRANT of the RIGHT breast. No apparent complications. Electronically Signed   By: Evangeline Dakin M.D.   On: 08/20/2020 08:07      IMPRESSION/PLAN: Right breast cancer  For the patient's early stage favorable risk breast cancer, we had a thorough discussion about her options for adjuvant therapy. One option would be antiestrogen therapy as discussed with medical oncology. She would take a pill for approximately 5 years. The alternative option (but less standard) would be radiotherapy to the breast. The most aggressive option would be to pursue both modalities.  Of note, I discussed the data from the [employer] al trial in the [place] [employer]. She understands that tamoxifen compared to radiation plus tamoxifen demonstrated no survival benefit among the women in this study. The women were 80 years or older with stage I estrogen receptor positive breast cancer. Based on this study, I told the patient that her overall life expectancy should not be affected by adding radiotherapy to antiestrogen medication. She understands that the main benefit  of adding radiotherapy to anti estrogen therapy would be a very small but measurable local control benefit (~7-8%).     I think it is reasonable for her to undergo radiation therapy given her excellent health, excellent life expectancy, and the possibility that she will have trouble tolerating the antiestrogen therapy, given her reported history of having difficulty tolerating medications in general.  We discussed the risks benefits and side effects of radiotherapy. She understands that the side effects would likely include some skin irritation, swelling, and fatigue during the weeks of radiation.  We discussed technology used to minimize risk to the heart and lungs.  I  anticipate delivering approximately 4 weeks of radiotherapy.  After a thorough discussion, the patient would like to proceed.  We will schedule her for treatment planning in the next week.  She like to start treatment on November 8 so that her side effects  are not too severe by Thanksgiving but adequately resolved by Christmas.  We discussed measures to reduce the risk of infection during the COVID-19 pandemic.  We discussed the pros and cons of getting vaccinated.  I explained to her why I strongly recommend that she get vaccinated.  We discussed this in detail and her questions were answered.  At this time she declines the vaccine but she knows to contact me if she changes her mind and would like to receive it at the cancer center.  On date of service, in total, I spent 55 minutes on this encounter. This encounter was provided by telemedicine platform by telephone.  The patient opted for telemedicine to maximize safety during the pandemic.  MyChart video was not obtainable. The patient has given verbal consent for this type of encounter and has been advised to only accept a meeting of this type in a secure network environment. The attendants for this meeting include Jean Schwartz  and Jean Sauer Page Lennox Grumbles.  During the encounter, Jean Schwartz was located at [facility] Radiation Oncology Department.  Jean Schwartz was located at home.    __________________________________________   Jean Gibson, MD  This document serves as a record of services personally performed by Jean Gibson, MD. It was created on his behalf by Clerance Lav, a trained medical scribe. The creation of this record is based on the scribe's personal observations and the provider's statements to them. This document has been checked and approved by the attending provider.

## 2020-09-07 NOTE — Progress Notes (Signed)
Location of Breast Cancer: Malignant neoplasm of lower-outer quadrant of RIGHT breast, estrogen receptor positive   Histology per Pathology Report:  08/21/2020 FINAL MICROSCOPIC DIAGNOSIS:  A. 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  - Low grade ductal carcinoma in situ.  - Invasive carcinoma is <1 mm from the lateral margin broadly, see  comment.  - Margins are negative for in situ carcinoma.  - See oncology table.  B. LYMPH NODE, RIGHT AXILLA, SENTINEL, EXCISION:  - One of one lymph nodes negative for carcinoma (0/1).  C. BREAST, RIGHT ADDITIONAL MEDIAL MARGIN, EXCISION:  - Benign breast tissue.  D. BREAST, RIGHT ADDITIONAL SUPERIOR MARGIN, EXCISION:  - Benign breast tissue.  E. BREAST, RIGHT ADDITIONAL ANTERIOR MARGIN, EXCISION:  - Benign breast tissue.  F. BREAST, RIGHT ADDITIONAL POSTERIOR MARGIN, EXCISION:  - Benign breast tissue.   Receptor Status: ER(100%), PR (90%), Her2-neu (negative), Ki-67(not performed)  Did patient present with symptoms (if so, please note symptoms) or was this found on screening mammography?:  Patient's mass was found by screening mammogram. She did not feel the mass herself. She notes that several year ago she required an [place] due to abnormal mammogram, but results were benign  Past/Anticipated interventions by surgeon, if any: 08/21/2020 Dr. Rolm Bookbinder 1. Right breast radioactive seed guided lumpectomy 2. Right deep axillary sentinel lymph node biopsy  Past/Anticipated interventions by medical oncology, if any:  Under care of Dr. Truitt Merle --08/10/2020 PLAN:  -Baseline Lab today including CBC and CMP  -Request DEXA from PCP office  -Proceed with surgery on 08/20/20  -F/u after Surgery or Radiation.  -I left a message for pathology to see if they can diagnose her tumor subtype (ductal vs lobular)   --09/03/2020 Dawn Stuart-Navigator: "Spoke to pt concerning Oncotype score of 10. Discussed  chemo not recommended. Referral placed for pt to see Dr. Isidore Moos."  Lymphedema issues, if any:  Patient denies. She reports she saw her surgeon last week and he drew some fluid off from a knot in her breast, but otherwise no issues.  Pain issues, if any:  Patient denies. She states it is uncomfortable to wear a bra, but otherwise no issues with pain  SAFETY ISSUES:  Prior radiation? No  Pacemaker/ICD? No  Possible current pregnancy? No--postmenopausal  Is the patient on methotrexate? No  Current Complaints / other details:  Nothing of note

## 2020-09-08 ENCOUNTER — Encounter (HOSPITAL_COMMUNITY): Payer: Self-pay | Admitting: Hematology

## 2020-09-08 ENCOUNTER — Ambulatory Visit
Admission: RE | Admit: 2020-09-08 | Discharge: 2020-09-08 | Disposition: A | Discharge status: Home / Self Care | Payer: Medicare HMO | Source: Ambulatory Visit | Attending: Radiation Oncology | Admitting: Radiation Oncology

## 2020-09-08 ENCOUNTER — Encounter: Payer: Self-pay | Admitting: Radiation Oncology

## 2020-09-08 DIAGNOSIS — Z170 Estrogen receptor positive status [ER+]: Secondary | ICD-10-CM

## 2020-09-08 DIAGNOSIS — Z9889 Other specified postprocedural states: Secondary | ICD-10-CM | POA: Diagnosis not present

## 2020-09-08 DIAGNOSIS — Z809 Family history of malignant neoplasm, unspecified: Secondary | ICD-10-CM | POA: Diagnosis not present

## 2020-09-08 DIAGNOSIS — C50511 Malignant neoplasm of lower-outer quadrant of right female breast: Secondary | ICD-10-CM | POA: Diagnosis not present

## 2020-09-10 ENCOUNTER — Encounter: Payer: Self-pay | Admitting: *Deleted

## 2020-09-11 ENCOUNTER — Encounter: Payer: Self-pay | Admitting: *Deleted

## 2020-09-14 ENCOUNTER — Ambulatory Visit: Payer: Medicare HMO

## 2020-09-15 ENCOUNTER — Ambulatory Visit: Admission: RE | Admit: 2020-09-15 | Payer: Medicare HMO | Source: Ambulatory Visit | Admitting: Radiation Oncology

## 2020-09-15 ENCOUNTER — Other Ambulatory Visit: Payer: Self-pay

## 2020-09-15 DIAGNOSIS — C50511 Malignant neoplasm of lower-outer quadrant of right female breast: Secondary | ICD-10-CM | POA: Insufficient documentation

## 2020-09-15 DIAGNOSIS — Z510 Encounter for antineoplastic radiation therapy: Secondary | ICD-10-CM | POA: Insufficient documentation

## 2020-09-15 DIAGNOSIS — Z170 Estrogen receptor positive status [ER+]: Secondary | ICD-10-CM | POA: Insufficient documentation

## 2020-09-16 DIAGNOSIS — Z510 Encounter for antineoplastic radiation therapy: Secondary | ICD-10-CM | POA: Diagnosis not present

## 2020-09-16 DIAGNOSIS — C50511 Malignant neoplasm of lower-outer quadrant of right female breast: Secondary | ICD-10-CM | POA: Diagnosis not present

## 2020-09-16 DIAGNOSIS — Z170 Estrogen receptor positive status [ER+]: Secondary | ICD-10-CM | POA: Diagnosis not present

## 2020-09-17 ENCOUNTER — Encounter: Payer: Self-pay | Admitting: *Deleted

## 2020-09-17 ENCOUNTER — Encounter: Payer: Self-pay | Admitting: Physical Therapy

## 2020-09-17 ENCOUNTER — Ambulatory Visit: Payer: Medicare HMO | Attending: Surgery | Admitting: Physical Therapy

## 2020-09-17 ENCOUNTER — Other Ambulatory Visit: Payer: Self-pay

## 2020-09-17 DIAGNOSIS — C50411 Malignant neoplasm of upper-outer quadrant of right female breast: Secondary | ICD-10-CM

## 2020-09-17 DIAGNOSIS — R293 Abnormal posture: Secondary | ICD-10-CM | POA: Insufficient documentation

## 2020-09-17 DIAGNOSIS — Z170 Estrogen receptor positive status [ER+]: Secondary | ICD-10-CM | POA: Diagnosis not present

## 2020-09-17 NOTE — Therapy (Signed)
[place], [place], [address] Phone: [phone]   Fax:  [phone]  Physical Therapy Treatment  Patient Details  Name: Jean Schwartz MRN: [id number] Date of Birth: 1946/09/11 Referring Provider (PT): Ave Filter Date: 09/17/2020   PT End of Session - 09/17/20 0946    Visit Number 2    Number of Visits 2    Date for PT Re-Evaluation 10/06/20    PT Start Time 0908    PT Stop Time 0940    PT Time Calculation (min) 32 min    Activity Tolerance Patient tolerated treatment well    Behavior During Therapy [facility] for tasks assessed/performed           Past Medical History:  Diagnosis Date  . Allergy   . Arthritis   . Breast cancer ([place])    "right"  . Cataract   . Complication of anesthesia    per patient, for colonoscopy it was hard to wake up, but for previous surgery no problems waking   . GERD (gastroesophageal reflux disease)   . Osteoporosis     Past Surgical History:  Procedure Laterality Date  . BREAST BIOPSY Right 07/23/2020   [place] Bx, Vision clip, path pending   . BREAST LUMPECTOMY WITH RADIOACTIVE SEED AND SENTINEL LYMPH NODE BIOPSY Right 08/21/2020   Procedure: RIGHT BREAST LUMPECTOMY WITH RADIOACTIVE SEED AND RIGHT AXILLARY SENTINEL LYMPH NODE BIOPSY;  Surgeon: Rolm Bookbinder, MD;  Location: [facility];  Service: General;  Laterality: Right;  PEC BLOCK  . CATARACT EXTRACTION W/ INTRAOCULAR LENS  IMPLANT, BILATERAL Bilateral   . COLONOSCOPY    . TUBAL LIGATION      There were no vitals filed for this visit.   Subjective Assessment - 09/17/20 0909    Subjective I am doing ok. My range of motion is normal. I was having some swelling 2 weeks ago but I had it drained and now it is fine.    Pertinent History R breast cancer, plan is to undergo a R breast lumpectomy and SLNB on 08/21/20 ER/PR+, HER2-, pt will need radiation    Patient Stated Goals to gain info from provider     Currently in Pain? Yes    Pain Score 2     Pain Location Breast    Pain Orientation Right    Pain Descriptors / Indicators --   nerve pain   Pain Type Surgical pain    Pain Onset 1 to 4 weeks ago    Pain Frequency Intermittent    Aggravating Factors  wearing a bra    Pain Relieving Factors taking bra off at night    Effect of Pain on Daily Activities no effect              [facility] PT Assessment - 09/17/20 0001      Observation/Other Assessments   Skin Integrity scar in axilla and in inferior breast is well healed- some fibrosis noted around scar line      AROM   Right Shoulder Flexion 150 Degrees    Right Shoulder ABduction 163 Degrees    Right Shoulder Internal Rotation 71 Degrees    Right Shoulder External Rotation 90 Degrees             LYMPHEDEMA/ONCOLOGY QUESTIONNAIRE - 09/17/20 0001      Surgeries   Lumpectomy Date 08/21/20    Sentinel Lymph Node Biopsy Date 08/21/20    Number Lymph Nodes Removed 1  node was negative     Treatment   Active Radiation Treatment No   will begin Nov 8     What other symptoms do you have   Are you Having Heaviness or Tightness No   occasional tightness at R pec     Right Upper Extremity Lymphedema   15 cm Proximal to Olecranon Process 25.6 cm    Olecranon Process 22.2 cm    15 cm Proximal to Ulnar Styloid Process 21 cm    Just Proximal to Ulnar Styloid Process 13.5 cm    Across Hand at [employer] 17 cm    At [place] of 2nd Digit 5.1 cm                      [facility] Adult PT Treatment/Exercise - 09/17/20 0001      Manual Therapy   Manual Therapy Edema management    Edema Management cut 1/2 grey foam and placed in thick stockinette that pt can wear over edge of bra to avoid it rubbing where her scar is located                  PT Education - 09/17/20 0945    Education Details Instructed pt in desensitization techniques, scar mobilization, and importance of end range shoulder stretching throughout  radiation to avoid tightness    Person(s) Educated Patient    Methods Explanation    Comprehension Verbalized understanding               PT Long Term Goals - 09/17/20 0945      PT LONG TERM GOAL #1   Title Pt will return to baseline shoulder ROM to allow pt to return to PLOF.    Time 8    Period Weeks    Status Achieved                 Plan - 09/17/20 0946    Clinical Impression Statement Pt returned to PT for post op follow up after a R breast lumpectomy and SLNB on 08/21/20. Pt reports she has been doing well and has not had trouble with ROM. She did develop a seroma that required draining but reports that it has now gone away. She does have nerve sensitivity in R breast so instructed pt in nerve desensitization technique. Pt has returned to baseline R shoulder ROM and does not demonstrate any increases in R shoulder circumferences. Pt will require radiation and was educated on importance of end range shoulder stretching throughout radiation. Pt will be discharged from skilled PT services at this time.    PT Frequency --   eval and 1 f/u   PT Duration 8 weeks    PT Treatment/Interventions ADLs/Self Care Home Management;Therapeutic exercise;Patient/family education    PT Next Visit Plan d/c this visit    PT Home Exercise Plan post op breast exercises, scar mobilization (begin around scar for now and in 2 weeks can go over scar), nerve desisitization    Consulted and Agree with Plan of Care Patient           Patient will benefit from skilled therapeutic intervention in order to improve the following deficits and impairments:  Postural dysfunction, Decreased knowledge of precautions  Visit Diagnosis: Abnormal posture  Malignant neoplasm of upper-outer quadrant of right breast in female, estrogen receptor positive ([place])     Problem List Patient Active Problem List   Diagnosis Date Noted  . Malignant neoplasm of lower-outer  quadrant of right breast of female,  estrogen receptor positive ([place]) 08/10/2020  . Chest heaviness 10/02/2017    Allyson Sabal [facility] 09/17/2020, 9:51 AM  [place], [place], [address] Phone: [phone]   Fax:  [phone]  Name: Jean Schwartz MRN: [id number] Date of Birth: [birth year]  Manus Gunning, PT 09/17/20 9:52 AM  PHYSICAL THERAPY DISCHARGE SUMMARY  Visits from Start of Care: 2  Current functional level related to goals / functional outcomes: All goals met   Remaining deficits: None   Education / Equipment: HEP, nerve desensitization, scar mobilization  Plan: Patient agrees to discharge.  Patient goals were met. Patient is being discharged due to meeting the stated rehab goals.  ?????     Allyson Sabal [place], [place] 09/17/20 9:52 AM

## 2020-09-28 ENCOUNTER — Other Ambulatory Visit: Payer: Self-pay

## 2020-09-28 ENCOUNTER — Ambulatory Visit
Admission: RE | Admit: 2020-09-28 | Discharge: 2020-09-28 | Disposition: A | Discharge status: Home / Self Care | Payer: Medicare HMO | Source: Ambulatory Visit | Attending: Radiation Oncology | Admitting: Radiation Oncology

## 2020-09-28 DIAGNOSIS — C50511 Malignant neoplasm of lower-outer quadrant of right female breast: Secondary | ICD-10-CM | POA: Diagnosis not present

## 2020-09-28 DIAGNOSIS — Z170 Estrogen receptor positive status [ER+]: Secondary | ICD-10-CM | POA: Insufficient documentation

## 2020-09-28 MED ORDER — ALRA NON-METALLIC DEODORANT (RAD-ONC)
1.0000 "application " | Freq: Once | TOPICAL | Status: AC
  Administered 2020-09-28: 1 via TOPICAL

## 2020-09-28 MED ORDER — RADIAPLEXRX EX GEL: Freq: Once | CUTANEOUS | Status: AC

## 2020-09-28 NOTE — Progress Notes (Signed)

## 2020-09-29 ENCOUNTER — Telehealth: Payer: Self-pay | Admitting: Hematology

## 2020-09-29 ENCOUNTER — Ambulatory Visit
Admission: RE | Admit: 2020-09-29 | Discharge: 2020-09-29 | Disposition: A | Discharge status: Home / Self Care | Payer: Medicare HMO | Source: Ambulatory Visit | Attending: Radiation Oncology | Admitting: Radiation Oncology

## 2020-09-29 DIAGNOSIS — Z79899 Other long term (current) drug therapy: Secondary | ICD-10-CM | POA: Diagnosis not present

## 2020-09-29 DIAGNOSIS — I10 Essential (primary) hypertension: Secondary | ICD-10-CM | POA: Diagnosis not present

## 2020-09-29 DIAGNOSIS — Z170 Estrogen receptor positive status [ER+]: Secondary | ICD-10-CM | POA: Diagnosis not present

## 2020-09-29 DIAGNOSIS — E7800 Pure hypercholesterolemia, unspecified: Secondary | ICD-10-CM | POA: Diagnosis not present

## 2020-09-29 DIAGNOSIS — R7989 Other specified abnormal findings of blood chemistry: Secondary | ICD-10-CM | POA: Diagnosis not present

## 2020-09-29 DIAGNOSIS — C50511 Malignant neoplasm of lower-outer quadrant of right female breast: Secondary | ICD-10-CM | POA: Diagnosis not present

## 2020-09-29 NOTE — Telephone Encounter (Signed)
Scheduled appt per 11/8 sch msg - mailed reminder letter with appt date and time

## 2020-09-30 ENCOUNTER — Ambulatory Visit
Admission: RE | Admit: 2020-09-30 | Discharge: 2020-09-30 | Disposition: A | Discharge status: Home / Self Care | Payer: Medicare HMO | Source: Ambulatory Visit | Attending: Radiation Oncology | Admitting: Radiation Oncology

## 2020-09-30 DIAGNOSIS — C50511 Malignant neoplasm of lower-outer quadrant of right female breast: Secondary | ICD-10-CM | POA: Diagnosis not present

## 2020-09-30 DIAGNOSIS — Z170 Estrogen receptor positive status [ER+]: Secondary | ICD-10-CM | POA: Diagnosis not present

## 2020-10-01 ENCOUNTER — Ambulatory Visit
Admission: RE | Admit: 2020-10-01 | Discharge: 2020-10-01 | Disposition: A | Discharge status: Home / Self Care | Payer: Medicare HMO | Source: Ambulatory Visit | Attending: Radiation Oncology | Admitting: Radiation Oncology

## 2020-10-01 DIAGNOSIS — C50511 Malignant neoplasm of lower-outer quadrant of right female breast: Secondary | ICD-10-CM | POA: Diagnosis not present

## 2020-10-01 DIAGNOSIS — Z170 Estrogen receptor positive status [ER+]: Secondary | ICD-10-CM | POA: Diagnosis not present

## 2020-10-02 ENCOUNTER — Ambulatory Visit
Admission: RE | Admit: 2020-10-02 | Discharge: 2020-10-02 | Disposition: A | Discharge status: Home / Self Care | Payer: Medicare HMO | Source: Ambulatory Visit | Attending: Radiation Oncology | Admitting: Radiation Oncology

## 2020-10-02 ENCOUNTER — Encounter: Payer: Self-pay | Admitting: Hematology

## 2020-10-02 DIAGNOSIS — Z170 Estrogen receptor positive status [ER+]: Secondary | ICD-10-CM | POA: Diagnosis not present

## 2020-10-02 DIAGNOSIS — C50511 Malignant neoplasm of lower-outer quadrant of right female breast: Secondary | ICD-10-CM | POA: Diagnosis not present

## 2020-10-05 ENCOUNTER — Other Ambulatory Visit: Payer: Self-pay

## 2020-10-05 ENCOUNTER — Ambulatory Visit
Admission: RE | Admit: 2020-10-05 | Discharge: 2020-10-05 | Disposition: A | Discharge status: Home / Self Care | Payer: Medicare HMO | Source: Ambulatory Visit | Attending: Radiation Oncology | Admitting: Radiation Oncology

## 2020-10-05 DIAGNOSIS — C50511 Malignant neoplasm of lower-outer quadrant of right female breast: Secondary | ICD-10-CM | POA: Diagnosis not present

## 2020-10-05 DIAGNOSIS — Z170 Estrogen receptor positive status [ER+]: Secondary | ICD-10-CM | POA: Diagnosis not present

## 2020-10-06 ENCOUNTER — Ambulatory Visit
Admission: RE | Admit: 2020-10-06 | Discharge: 2020-10-06 | Disposition: A | Discharge status: Home / Self Care | Payer: Medicare HMO | Source: Ambulatory Visit | Attending: Radiation Oncology | Admitting: Radiation Oncology

## 2020-10-06 DIAGNOSIS — C50511 Malignant neoplasm of lower-outer quadrant of right female breast: Secondary | ICD-10-CM | POA: Diagnosis not present

## 2020-10-06 DIAGNOSIS — Z170 Estrogen receptor positive status [ER+]: Secondary | ICD-10-CM | POA: Diagnosis not present

## 2020-10-07 ENCOUNTER — Ambulatory Visit
Admission: RE | Admit: 2020-10-07 | Discharge: 2020-10-07 | Disposition: A | Discharge status: Home / Self Care | Payer: Medicare HMO | Source: Ambulatory Visit | Attending: Radiation Oncology | Admitting: Radiation Oncology

## 2020-10-07 DIAGNOSIS — C50511 Malignant neoplasm of lower-outer quadrant of right female breast: Secondary | ICD-10-CM | POA: Diagnosis not present

## 2020-10-07 DIAGNOSIS — Z170 Estrogen receptor positive status [ER+]: Secondary | ICD-10-CM | POA: Diagnosis not present

## 2020-10-08 ENCOUNTER — Ambulatory Visit
Admission: RE | Admit: 2020-10-08 | Discharge: 2020-10-08 | Disposition: A | Discharge status: Home / Self Care | Payer: Medicare HMO | Source: Ambulatory Visit | Attending: Radiation Oncology | Admitting: Radiation Oncology

## 2020-10-08 DIAGNOSIS — Z170 Estrogen receptor positive status [ER+]: Secondary | ICD-10-CM | POA: Diagnosis not present

## 2020-10-08 DIAGNOSIS — C50511 Malignant neoplasm of lower-outer quadrant of right female breast: Secondary | ICD-10-CM | POA: Diagnosis not present

## 2020-10-09 ENCOUNTER — Ambulatory Visit
Admission: RE | Admit: 2020-10-09 | Discharge: 2020-10-09 | Disposition: A | Discharge status: Home / Self Care | Payer: Medicare HMO | Source: Ambulatory Visit | Attending: Radiation Oncology | Admitting: Radiation Oncology

## 2020-10-09 DIAGNOSIS — Z170 Estrogen receptor positive status [ER+]: Secondary | ICD-10-CM | POA: Diagnosis not present

## 2020-10-09 DIAGNOSIS — C50511 Malignant neoplasm of lower-outer quadrant of right female breast: Secondary | ICD-10-CM | POA: Diagnosis not present

## 2020-10-12 ENCOUNTER — Other Ambulatory Visit: Payer: Self-pay

## 2020-10-12 ENCOUNTER — Ambulatory Visit
Admission: RE | Admit: 2020-10-12 | Discharge: 2020-10-12 | Disposition: A | Discharge status: Home / Self Care | Payer: Medicare HMO | Source: Ambulatory Visit | Attending: Radiation Oncology | Admitting: Radiation Oncology

## 2020-10-12 DIAGNOSIS — C50511 Malignant neoplasm of lower-outer quadrant of right female breast: Secondary | ICD-10-CM | POA: Diagnosis not present

## 2020-10-12 DIAGNOSIS — Z170 Estrogen receptor positive status [ER+]: Secondary | ICD-10-CM | POA: Diagnosis not present

## 2020-10-13 ENCOUNTER — Ambulatory Visit
Admission: RE | Admit: 2020-10-13 | Discharge: 2020-10-13 | Disposition: A | Discharge status: Home / Self Care | Payer: Medicare HMO | Source: Ambulatory Visit | Attending: Radiation Oncology | Admitting: Radiation Oncology

## 2020-10-13 DIAGNOSIS — Z170 Estrogen receptor positive status [ER+]: Secondary | ICD-10-CM | POA: Diagnosis not present

## 2020-10-13 DIAGNOSIS — C50511 Malignant neoplasm of lower-outer quadrant of right female breast: Secondary | ICD-10-CM | POA: Diagnosis not present

## 2020-10-14 ENCOUNTER — Ambulatory Visit
Admission: RE | Admit: 2020-10-14 | Discharge: 2020-10-14 | Disposition: A | Discharge status: Home / Self Care | Payer: Medicare HMO | Source: Ambulatory Visit | Attending: Radiation Oncology | Admitting: Radiation Oncology

## 2020-10-14 DIAGNOSIS — C50511 Malignant neoplasm of lower-outer quadrant of right female breast: Secondary | ICD-10-CM | POA: Diagnosis not present

## 2020-10-14 DIAGNOSIS — Z170 Estrogen receptor positive status [ER+]: Secondary | ICD-10-CM | POA: Diagnosis not present

## 2020-10-19 ENCOUNTER — Ambulatory Visit
Admission: RE | Admit: 2020-10-19 | Discharge: 2020-10-19 | Disposition: A | Discharge status: Home / Self Care | Payer: Medicare HMO | Source: Ambulatory Visit | Attending: Radiation Oncology | Admitting: Radiation Oncology

## 2020-10-19 ENCOUNTER — Other Ambulatory Visit: Payer: Self-pay

## 2020-10-19 ENCOUNTER — Ambulatory Visit: Payer: Medicare HMO | Admitting: Radiation Oncology

## 2020-10-19 DIAGNOSIS — C50511 Malignant neoplasm of lower-outer quadrant of right female breast: Secondary | ICD-10-CM | POA: Diagnosis not present

## 2020-10-19 DIAGNOSIS — Z170 Estrogen receptor positive status [ER+]: Secondary | ICD-10-CM | POA: Diagnosis not present

## 2020-10-20 ENCOUNTER — Ambulatory Visit
Admission: RE | Admit: 2020-10-20 | Discharge: 2020-10-20 | Disposition: A | Discharge status: Home / Self Care | Payer: Medicare HMO | Source: Ambulatory Visit | Attending: Radiation Oncology | Admitting: Radiation Oncology

## 2020-10-20 DIAGNOSIS — Z170 Estrogen receptor positive status [ER+]: Secondary | ICD-10-CM | POA: Diagnosis not present

## 2020-10-20 DIAGNOSIS — C50511 Malignant neoplasm of lower-outer quadrant of right female breast: Secondary | ICD-10-CM | POA: Diagnosis not present

## 2020-10-21 ENCOUNTER — Ambulatory Visit
Admission: RE | Admit: 2020-10-21 | Discharge: 2020-10-21 | Disposition: A | Discharge status: Home / Self Care | Payer: Medicare HMO | Source: Ambulatory Visit | Attending: Radiation Oncology | Admitting: Radiation Oncology

## 2020-10-21 DIAGNOSIS — C50511 Malignant neoplasm of lower-outer quadrant of right female breast: Secondary | ICD-10-CM | POA: Diagnosis not present

## 2020-10-21 DIAGNOSIS — Z170 Estrogen receptor positive status [ER+]: Secondary | ICD-10-CM | POA: Diagnosis not present

## 2020-10-22 ENCOUNTER — Ambulatory Visit
Admission: RE | Admit: 2020-10-22 | Discharge: 2020-10-22 | Disposition: A | Discharge status: Home / Self Care | Payer: Medicare HMO | Source: Ambulatory Visit | Attending: Radiation Oncology | Admitting: Radiation Oncology

## 2020-10-22 DIAGNOSIS — Z170 Estrogen receptor positive status [ER+]: Secondary | ICD-10-CM | POA: Diagnosis not present

## 2020-10-22 DIAGNOSIS — C50511 Malignant neoplasm of lower-outer quadrant of right female breast: Secondary | ICD-10-CM | POA: Diagnosis not present

## 2020-10-23 ENCOUNTER — Ambulatory Visit
Admission: RE | Admit: 2020-10-23 | Discharge: 2020-10-23 | Disposition: A | Discharge status: Home / Self Care | Payer: Medicare HMO | Source: Ambulatory Visit | Attending: Radiation Oncology | Admitting: Radiation Oncology

## 2020-10-23 DIAGNOSIS — C50511 Malignant neoplasm of lower-outer quadrant of right female breast: Secondary | ICD-10-CM | POA: Diagnosis not present

## 2020-10-23 DIAGNOSIS — Z170 Estrogen receptor positive status [ER+]: Secondary | ICD-10-CM | POA: Diagnosis not present

## 2020-10-23 NOTE — Progress Notes (Signed)
[name]   [phone] [phone]   Clinic Follow up Note   Patient Care Team: Idelle Crouch, MD as PCP - General (Internal Medicine) Mauro Kaufmann, RN as Oncology Nurse Navigator Rockwell Germany, RN as Oncology Nurse Navigator Truitt Merle, MD as Consulting Physician (Hematology) Rolm Bookbinder, MD as Consulting Physician (General Surgery)  Date of Service:  10/26/2020  CHIEF COMPLAINT: F/u of right breast cancer  SUMMARY OF ONCOLOGIC HISTORY: Oncology History Overview Note  Cancer Staging Malignant neoplasm of lower-outer quadrant of right breast of female, estrogen receptor positive ([place]) Staging form: Breast, AJCC 8th Edition - Clinical stage from 07/23/2020: Stage IA (cT1b, cN0, cM0, G2, ER+, PR+, HER2-) - Signed by Truitt Merle, MD on 08/10/2020    Malignant neoplasm of lower-outer quadrant of right breast of female, estrogen receptor positive ([place])  07/14/2020 Mammogram   Diagnostic Mammogram 07/14/20  IMPRESSION: 1.0 x 1.0 x 0.9cm mass in the 8 o'clock position of the right breast 3cmfn with imaging features suspicious for malignancy.   07/23/2020 Cancer Staging   Staging form: Breast, AJCC 8th Edition - Clinical stage from 07/23/2020: Stage IA (cT1b, cN0, cM0, G2, ER+, PR+, HER2-) - Signed by Truitt Merle, MD on 08/10/2020   07/23/2020 Initial Biopsy   DIAGNOSIS: 07/23/20 A. BREAST, RIGHT 8:00 3 CM FN; ULTRASOUND-GUIDED BIOPSY:  - INVASIVE MAMMARY CARCINOMA, NO SPECIAL TYPE.    07/23/2020 Receptors her2   ADDENDUM:  BREAST BIOMARKER TESTS  Estrogen Receptor (ER) Status: Positive (greater than 10% of cells  demonstrate nuclear positivity)                       Percentage of cells with nuclear positivity:  91-100%                       Average intensity of staining: Strong   Progesterone Receptor (PgR) Status: Positive (greater than 10% of cells  demonstrate nuclear positivity)                       Percentage of cells with nuclear  positivity: 51-90%                       Average intensity of staining: Moderate   HER2 (by immunohistochemistry): Negative (Score 0)    08/10/2020 Initial Diagnosis   Malignant neoplasm of lower-outer quadrant of right breast of female, estrogen receptor positive ([place])   08/21/2020 Surgery   RIGHT BREAST LUMPECTOMY WITH RADIOACTIVE SEED AND RIGHT AXILLARY SENTINEL LYMPH NODE BIOPSY by Dr Donne Hazel     08/21/2020 Pathology Results   FINAL MICROSCOPIC DIAGNOSIS:   A. 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  - Low grade ductal carcinoma in situ.  - Invasive carcinoma is <1 mm from the lateral margin broadly, see  comment.  - Margins are negative for in situ carcinoma.  - See oncology table.   B. LYMPH NODE, RIGHT AXILLA, SENTINEL, EXCISION:  - One of one lymph nodes negative for carcinoma (0/1).   C. BREAST, RIGHT ADDITIONAL MEDIAL MARGIN, EXCISION:  - Benign breast tissue.   D. BREAST, RIGHT ADDITIONAL SUPERIOR MARGIN, EXCISION:  - Benign breast tissue.   E. BREAST, RIGHT ADDITIONAL ANTERIOR MARGIN, EXCISION:  - Benign breast tissue.   F. BREAST, RIGHT ADDITIONAL POSTERIOR MARGIN, EXCISION:  - Benign breast tissue.    08/21/2020 Oncotype testing   Oncotype  Recurrence Score 10 with distant recurrence risk at 9 years at 3% with AI or Tamoxifen alone  Less than 1% benefit of Adjuvant chemothreapy.     09/28/2020 - 10/27/2020 Radiation Therapy   Adjuvant Radiation with Dr Isidore Moos    11/2020 -  Anti-estrogen oral therapy   Anastrozole 87m daily starting 11/2020       CURRENT THERAPY:  Adjuvant Radiation with Dr SIsidore Moos11/8/21-12/7/21 Anastrozole 134mdaily starting 11/2020    INTERVAL HISTORY:  [name] here for a follow up. She presents to the clinic alone. She notes she is dong well and tolerating Radiation well. She notes mild skin irration and mild fatigue. She notes she was given tramadol after surgery but did not  need it. She was given Cymbalta. She is on B12. She notes she has manageable stiffness in her fingers from arthritis. She would opted to proceed with anastrozole first. She will think about Zometa infusion. She notes concerns about being sensitive to medications with nausea and vomiting.     REVIEW OF SYSTEMS:   Constitutional: Denies fevers, chills or abnormal weight loss (+) Mild fatigue  Eyes: Denies blurriness of vision Ears, nose, mouth, throat, and face: Denies mucositis or sore throat Respiratory: Denies cough, dyspnea or wheezes Cardiovascular: Denies palpitation, chest discomfort or lower extremity swelling Gastrointestinal:  Denies nausea, heartburn or change in bowel habits Skin: Denies abnormal skin rashes Lymphatics: Denies new lymphadenopathy or easy bruising Neurological:Denies numbness, tingling or new weaknesses Behavioral/Psych: Mood is stable, no new changes  All other systems were reviewed with the patient and are negative.  MEDICAL HISTORY:  Past Medical History:  Diagnosis Date  . Allergy   . Arthritis   . [place]   "[place]"  . Cataract   . Complication of anesthesia    per patient, for colonoscopy it was hard to wake up, but for previous surgery no problems waking   . GERD (gastroesophageal reflux disease)   . Osteoporosis     SURGICAL HISTORY: Past Surgical History:  Procedure Laterality Date  . BREAST BIOPSY Right 07/23/2020   USKoreax, Vision clip, path pending   . BREAST LUMPECTOMY WITH RADIOACTIVE SEED AND SENTINEL LYMPH NODE BIOPSY Right 08/21/2020   Procedure: RIGHT BREAST LUMPECTOMY WITH RADIOACTIVE SEED AND RIGHT AXILLARY SENTINEL LYMPH NODE BIOPSY;  Surgeon: WaRolm BookbinderMD;  [place] [facility] [place]: General;  Laterality: Right;  PEC BLOCK  . CATARACT EXTRACTION W/ INTRAOCULAR LENS  IMPLANT, BILATERAL Bilateral   . COLONOSCOPY    . TUBAL LIGATION      I have reviewed the social history and family history with the patient and  they are unchanged from previous note.  ALLERGIES:  has No Known Allergies.  MEDICATIONS:  Current Outpatient Medications  Medication Sig Dispense Refill  . acetaminophen (TYLENOL) 500 MG tablet Take 500 mg by mouth every 6 (six) hours as needed.    . Marland Kitchennastrozole (ARIMIDEX) 1 MG tablet Take 1 tablet (1 mg total) by mouth daily. 30 tablet 3  . APPLE CIDER VINEGAR PO Take 2 tablets by mouth daily.    . Biotin w/ Vitamins C & E (HAIR/SKIN/NAILS PO) Take 1 tablet by mouth daily.    . Calcium Citrate (CITRACAL PO) Take 1 tablet by mouth daily.    . cholecalciferol (VITAMIN D) 1000 units tablet Take 1,000 Units daily by mouth.    . DULoxetine (CYMBALTA) 20 MG capsule Take 20 mg by mouth daily.    . fluticasone (  FLONASE) 50 MCG/ACT nasal spray Place 2 sprays into both nostrils daily as needed for allergies.     . Multiple Vitamin (MULTIVITAMIN) tablet Take 1 tablet daily by mouth.    . Omega-3 Fatty Acids (FISH OIL OMEGA-3) 1000 MG CAPS Take 1,000 mg by mouth daily.     . traMADol (ULTRAM) 50 MG tablet Take 2 tablets (100 mg total) by mouth every 6 (six) hours as needed. (Patient not taking: Reported on 09/08/2020) 10 tablet 0  . vitamin B-12 (CYANOCOBALAMIN) 1000 MCG tablet Take 1,000 mcg by mouth daily.     No current facility-administered medications for this visit.    PHYSICAL EXAMINATION: ECOG PERFORMANCE STATUS: 0 - Asymptomatic  Vitals:   10/26/20 0944  BP: 136/74  Pulse: 81  Resp: 16  Temp: 97.7 F (36.5 C)  SpO2: 100%   Filed Weights   10/26/20 0944  Weight: 128 lb 12.8 oz (58.4 kg)    GENERAL:alert, no distress and comfortable SKIN: skin color, texture, turgor are normal, no rashes or significant lesions EYES: normal, Conjunctiva are pink and non-injected, sclera clear  NECK: supple, thyroid normal size, non-tender, without nodularity LYMPH:  no palpable lymphadenopathy in the cervical, axillary  LUNGS: clear to auscultation and percussion with normal breathing effort  HEART: regular rate & rhythm and no murmurs and no lower extremity edema ABDOMEN:abdomen soft, non-tender and normal bowel sounds Musculoskeletal:no cyanosis of digits and no clubbing  NEURO: alert & oriented x 3 with fluent speech, no focal motor/sensory deficits BREAST: S/p right lumpectomy: Surgical incision healed well. (+) Very mild skin erythema of right breast from RT. No palpable mass, nodules or adenopathy bilaterally. Breast exam benign.   LABORATORY DATA:  I have reviewed the data as listed CBC Latest Ref Rng & Units 08/10/2020 10/03/2017 10/02/2017  WBC 4.0 - 10.5 K/uL 4.9 4.5 4.7  Hemoglobin 12.0 - 15.0 g/dL 13.7 13.0 14.3  Hematocrit 36 - 46 % 40.9 39.0 42.4  Platelets 150 - 400 K/uL 173 173 155     CMP Latest Ref Rng & Units 08/10/2020 10/03/2017 10/02/2017  Glucose 70 - 99 mg/dL 88 106(H) 109(H)  BUN 8 - 23 mg/dL _0 Creatinine 0.44 - 1.00 mg/dL 0.78 0.86 0.70  Sodium 135 - 145 mmol/L 142 138 141  Potassium 3.5 - 5.1 mmol/L 4.8 3.9 4.1  Chloride 98 - 111 mmol/L 108 105 106  CO2 22 - 32 mmol/L _1 Calcium 8.9 - 10.3 mg/dL 9.7 8.9 9.7  Total Protein 6.5 - 8.1 g/dL 6.7 - -  Total Bilirubin 0.3 - 1.2 mg/dL 0.6 - -  Alkaline Phos 38 - 126 U/L 65 - -  AST 15 - 41 U/L 24 - -  ALT 0 - 44 U/L 22 - -      RADIOGRAPHIC STUDIES: I have personally reviewed the radiological images as listed and agreed with the findings in the report. No results found.   ASSESSMENT & PLAN:  Jean Schwartz is a 74 y.o. female with    1. 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   -She underwent right breast lumpectomy and SLNB by Dr Donne Hazel on 08/21/20. Surgical path showed 1.1cm tumor with grade 1 invasive ductal carcinoma was completely removed, negative margins and LN  negative. I reviewed path with patient  today.  -Her Oncotype showed low risk with RS 10 and less than 1% benefit of chemotherapy. I did not recommend adjuvant chemotherapy.  -To reduce her risk of local recurrence she proceeded with Adjuvant Radiation with Dr Isidore Moos on 09/28/20. She plans to complete on 10/27/20. She is tolerating well with mild skin irritation and mild Fatigue.  -Given the strong ER and PR expression in her postmenopausal status, I recommend adjuvant endocrine therapy with aromatase inhibitor with Exemestane or anastrozole or Tamoxifen for 5 years to reduce the risk of cancer recurrence.   -The potential benefit and side effects of AI, which includes but not limited to, hot flash, skin and vaginal dryness, metabolic changes ( increased blood glucose, cholesterol, weight, etc.), slightly in increased risk of cardiovascular disease, cataracts, muscular and joint discomfort, osteopenia and osteoporosis, etc, were discussed with her in great details.   -The potential side effects of Tamoxifen, which includes but not limited to, hot flash, skin and vaginal dryness, slightly increased risk of cardiovascular disease and cataract, small risk of thrombosis and endometrial cancer, were discussed with her in great details.  -Given she has concern for endometrial cancer and high cost of Exemestane, she opted to try Anastrozole first. I gave her printout of medication. Plan to start after Radiation in 11/2020.  -Survivorship clinic in 3 months with NP Lacie and F/u with me in 6 months.    2. Osteoporosis -Seen in DEXA in the past 1-2 years at PCP office. I will request DEXA scan report form PCP office. She is agreeable. years ago she tried Fosamax but did not tolerate well due to N&V.  -Continue Calcium and Vit D.  -I discussed treatment with bisphosphonate Zometa injection q38month for 2 years to strengthen her bone and decreased risk of future bone metastasis from breast cancer recurrence. I  reviewed side effects with her including possible jaw necrosis. I gave her print out of medication. She will think about it, and let uKoreaknow if she would like to proceed at next visit.     PLAN:  -Continue Radiation. Plan to complete tomorrow  -Request DEXA reports from PCP office  -I called in Anastrozole today to start in 11/2020  -she will think about zometa  -Survivorship clinic with NP Lacie in 3 months  -Lab and f/u with me in 6 months.     No problem-specific Assessment & Plan notes found for this encounter.   No orders of the defined types were placed in this encounter.  All questions were answered. The patient knows to call the clinic with any problems, questions or concerns. No barriers to learning was detected. The total time spent in the appointment was 30 minutes.     YTruitt Merle MD 10/26/2020   I, AJoslyn Devon am acting as scribe for YTruitt Merle MD.   I have reviewed the above documentation for accuracy and completeness, and I agree with the above.

## 2020-10-26 ENCOUNTER — Other Ambulatory Visit: Payer: Self-pay

## 2020-10-26 ENCOUNTER — Inpatient Hospital Stay: Payer: Medicare HMO | Attending: Hematology | Admitting: Hematology

## 2020-10-26 ENCOUNTER — Encounter: Payer: Self-pay | Admitting: Hematology

## 2020-10-26 ENCOUNTER — Ambulatory Visit
Admission: RE | Admit: 2020-10-26 | Discharge: 2020-10-26 | Disposition: A | Discharge status: Home / Self Care | Payer: Medicare HMO | Source: Ambulatory Visit | Attending: Radiation Oncology | Admitting: Radiation Oncology

## 2020-10-26 ENCOUNTER — Encounter: Payer: Self-pay | Admitting: *Deleted

## 2020-10-26 VITALS — BP 136/74 | HR 81 | Temp 97.7°F | Resp 16 | Ht 62.0 in | Wt 128.8 lb

## 2020-10-26 DIAGNOSIS — Z170 Estrogen receptor positive status [ER+]: Secondary | ICD-10-CM | POA: Diagnosis not present

## 2020-10-26 DIAGNOSIS — Z79811 Long term (current) use of aromatase inhibitors: Secondary | ICD-10-CM | POA: Diagnosis not present

## 2020-10-26 DIAGNOSIS — K219 Gastro-esophageal reflux disease without esophagitis: Secondary | ICD-10-CM | POA: Diagnosis not present

## 2020-10-26 DIAGNOSIS — Z79899 Other long term (current) drug therapy: Secondary | ICD-10-CM | POA: Diagnosis not present

## 2020-10-26 DIAGNOSIS — C50511 Malignant neoplasm of lower-outer quadrant of right female breast: Secondary | ICD-10-CM | POA: Diagnosis not present

## 2020-10-26 MED ORDER — RADIAPLEXRX EX GEL: Freq: Once | CUTANEOUS | Status: AC

## 2020-10-26 MED ORDER — ANASTROZOLE 1 MG PO TABS: 1.0000 mg | ORAL_TABLET | Freq: Every day | ORAL | 3 refills | Status: DC

## 2020-10-27 ENCOUNTER — Ambulatory Visit
Admission: RE | Admit: 2020-10-27 | Discharge: 2020-10-27 | Disposition: A | Discharge status: Home / Self Care | Payer: Medicare HMO | Source: Ambulatory Visit | Attending: Radiation Oncology | Admitting: Radiation Oncology

## 2020-10-27 ENCOUNTER — Encounter: Payer: Self-pay | Admitting: Radiation Oncology

## 2020-10-27 DIAGNOSIS — C50511 Malignant neoplasm of lower-outer quadrant of right female breast: Secondary | ICD-10-CM | POA: Diagnosis not present

## 2020-10-27 DIAGNOSIS — Z170 Estrogen receptor positive status [ER+]: Secondary | ICD-10-CM | POA: Diagnosis not present

## 2020-11-16 ENCOUNTER — Ambulatory Visit: Payer: Medicare HMO

## 2020-11-27 ENCOUNTER — Ambulatory Visit: Payer: Self-pay | Admitting: Radiation Oncology

## 2020-12-16 NOTE — Progress Notes (Signed)
  Patient Name: Jean Schwartz MRN: [id number] DOB: 05/24/1946 Referring Physician: Truitt Merle (Profile Not Attached) Date of Service: 10/27/2020 [facility] [place]                                                        End Of Treatment Note  Diagnoses: C50.511-Malignant neoplasm of lower-outer quadrant of right female breast  Cancer Staging: Cancer Staging Malignant neoplasm of lower-outer quadrant of right breast of female, estrogen receptor positive ([place]) Staging form: Breast, AJCC 8th Edition - Clinical stage from 07/23/2020: Stage IA (cT1b, cN0, cM0, G2, ER+, PR+, HER2-) - Signed by Truitt Merle, MD on 08/10/2020  Stage IA (pT1c, pN0) Right Breast LOQ, Invasive Mammary Carcinoma, ER+ / PR+ / Her2-, Grade 2  Intent: Curative  Radiation Treatment Dates: 09/28/2020 through 10/27/2020 Site Technique Total Dose (Gy) Dose per Fx (Gy) Completed Fx Beam Energies  Breast, Right: Breast_Rt 3D 40.05/40.05 2.67 15/15 6X, 10X  Breast, Right: Breast_Rt_Bst 3D 10/10 2 5/5 6X, 10X   Narrative: The patient tolerated radiation therapy relatively well.   Plan: The patient will follow-up with radiation oncology in 53mo, or as needed.  -----------------------------------  Eppie Gibson, MD

## 2020-12-21 ENCOUNTER — Telehealth: Payer: Self-pay | Admitting: *Deleted

## 2020-12-21 NOTE — Telephone Encounter (Signed)
Received call from pt stating that she is on anastrozole & having trouble with her R jaw popping every time she eats.  It feels very tight when she is not talking or chewing.  She reports having this before starting anastrozole 11/27/20 but would just come & go & now it is staying.  She noticed when she was chewing something hard like nuts.  Asked if she had discussed with her dentist.  She has not.  Message to Dr Burr Medico.

## 2020-12-22 ENCOUNTER — Other Ambulatory Visit: Payer: Self-pay | Admitting: Hematology

## 2020-12-22 DIAGNOSIS — C50511 Malignant neoplasm of lower-outer quadrant of right female breast: Secondary | ICD-10-CM

## 2020-12-22 NOTE — Telephone Encounter (Signed)
I called her back,and discussed joint exercise, taking OTC glucosamine, etc. Also discussed  changing anastrozole to Exemestane or Tamoxifen. She would like to continue anastrozole for now and try exercise and OTC supplement. She also agreed for me to  schedule her survivorship visit again (she cancelled) in March.   Truitt Merle MD

## 2020-12-23 DIAGNOSIS — R946 Abnormal results of thyroid function studies: Secondary | ICD-10-CM | POA: Diagnosis not present

## 2020-12-24 ENCOUNTER — Telehealth: Payer: Self-pay

## 2020-12-24 ENCOUNTER — Telehealth: Payer: Self-pay | Admitting: Hematology

## 2020-12-24 NOTE — Telephone Encounter (Signed)
Ms Slaven called stating that the "cancer pill" has caused more side effects.  She states her arm hurt and the bone was sore. She also said it made her feel "sad".  She stopped taking anastrozole yesterday.  She states the arm and bone are better and she is not sad today.  I will caall her tomorrow to let her know Dr Ernestina Penna recommendations

## 2020-12-24 NOTE — Telephone Encounter (Signed)
Scheduled follow-up appointment per 2/1 schedule message. Patient is aware and stated she was waiting on a call back from the nurse regarding her medication. She stated she hasn't taken it yesterday or today due to the taste.

## 2020-12-31 DIAGNOSIS — Z0000 Encounter for general adult medical examination without abnormal findings: Secondary | ICD-10-CM | POA: Diagnosis not present

## 2020-12-31 DIAGNOSIS — R7989 Other specified abnormal findings of blood chemistry: Secondary | ICD-10-CM | POA: Diagnosis not present

## 2020-12-31 DIAGNOSIS — Z853 Personal history of malignant neoplasm of breast: Secondary | ICD-10-CM | POA: Diagnosis not present

## 2020-12-31 DIAGNOSIS — I10 Essential (primary) hypertension: Secondary | ICD-10-CM | POA: Diagnosis not present

## 2020-12-31 DIAGNOSIS — M810 Age-related osteoporosis without current pathological fracture: Secondary | ICD-10-CM | POA: Diagnosis not present

## 2020-12-31 DIAGNOSIS — E785 Hyperlipidemia, unspecified: Secondary | ICD-10-CM | POA: Diagnosis not present

## 2021-01-12 ENCOUNTER — Telehealth: Payer: Self-pay | Admitting: Nurse Practitioner

## 2021-01-12 NOTE — Telephone Encounter (Signed)
Moved upcoming appointment due to provider's template. Patient is aware of changes. 

## 2021-01-26 ENCOUNTER — Encounter: Payer: Medicare HMO | Admitting: Nurse Practitioner

## 2021-02-02 NOTE — Progress Notes (Signed)
CLINIC: Survivorship   Patient Care Team: Idelle Crouch, MD as PCP - General (Internal Medicine) Mauro Kaufmann, RN as Oncology Nurse Navigator Rockwell Germany, RN as Oncology Nurse Navigator Truitt Merle, MD as Consulting Physician (Hematology) Rolm Bookbinder, MD as Consulting Physician (General Surgery) Eppie Gibson, MD as Attending Physician (Radiation Oncology) Alla Feeling, NP as Nurse Practitioner (Nurse Practitioner)  I connected with Jean Schwartz on 02/03/21 at 12:30 PM EDT by telephone and verified that I am speaking with the correct person using two identifiers.  I discussed the limitations, risks, security and privacy concerns of performing an evaluation and management service by telephone and the availability of in person appointments. I also discussed with the patient that there may be a patient responsible charge related to this service. The patient expressed understanding and agreed to proceed.   BRIEF ONCOLOGIC HISTORY:  Oncology History Overview Note  Cancer Staging Malignant neoplasm of lower-outer quadrant of right breast of female, estrogen receptor positive ([place]) Staging form: Breast, AJCC 8th Edition - Clinical stage from 07/23/2020: Stage IA (cT1b, cN0, cM0, G2, ER+, PR+, HER2-) - Signed by Truitt Merle, MD on 08/10/2020    Malignant neoplasm of lower-outer quadrant of right breast of female, estrogen receptor positive ([place])  07/14/2020 Mammogram   Diagnostic Mammogram 07/14/20  IMPRESSION: 1.0 x 1.0 x 0.9cm mass in the 8 o'clock position of the right breast 3cmfn with imaging features suspicious for malignancy.   07/23/2020 Cancer Staging   Staging form: Breast, AJCC 8th Edition - Clinical stage from 07/23/2020: Stage IA (cT1b, cN0, cM0, G2, ER+, PR+, HER2-) - Signed by Truitt Merle, MD on 08/10/2020   07/23/2020 Initial Biopsy   DIAGNOSIS: 07/23/20 A. BREAST, RIGHT 8:00 3 CM FN; ULTRASOUND-GUIDED BIOPSY:  - INVASIVE MAMMARY CARCINOMA, NO SPECIAL TYPE.     07/23/2020 Receptors her2   ADDENDUM:  BREAST BIOMARKER TESTS  Estrogen Receptor (ER) Status: Positive (greater than 10% of cells  demonstrate nuclear positivity)                       Percentage of cells with nuclear positivity:  91-100%                       Average intensity of staining: Strong   Progesterone Receptor (PgR) Status: Positive (greater than 10% of cells  demonstrate nuclear positivity)                       Percentage of cells with nuclear positivity: 51-90%                       Average intensity of staining: Moderate   HER2 (by immunohistochemistry): Negative (Score 0)    08/10/2020 Initial Diagnosis   Malignant neoplasm of lower-outer quadrant of right breast of female, estrogen receptor positive ([place])   08/21/2020 Surgery   RIGHT BREAST LUMPECTOMY WITH RADIOACTIVE SEED AND RIGHT AXILLARY SENTINEL LYMPH NODE BIOPSY by Dr Donne Hazel     08/21/2020 Pathology Results   FINAL MICROSCOPIC DIAGNOSIS:   A. 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  - Low grade ductal carcinoma in situ.  - Invasive carcinoma is <1 mm from the lateral margin broadly, see  comment.  - Margins are negative for in situ carcinoma.  - See oncology table.   B. LYMPH NODE, RIGHT AXILLA, SENTINEL, EXCISION:  - One of one lymph  nodes negative for carcinoma (0/1).   C. BREAST, RIGHT ADDITIONAL MEDIAL MARGIN, EXCISION:  - Benign breast tissue.   D. BREAST, RIGHT ADDITIONAL SUPERIOR MARGIN, EXCISION:  - Benign breast tissue.   E. BREAST, RIGHT ADDITIONAL ANTERIOR MARGIN, EXCISION:  - Benign breast tissue.   F. BREAST, RIGHT ADDITIONAL POSTERIOR MARGIN, EXCISION:  - Benign breast tissue.    08/21/2020 Oncotype testing   Oncotype  Recurrence Score 10 with distant recurrence risk at 9 years at 3% with AI or Tamoxifen alone  Less than 1% benefit of Adjuvant chemothreapy.     08/21/2020 Cancer Staging   Staging form: Breast, AJCC 8th Edition -  Pathologic stage from 08/21/2020: Stage IA (pT1c, pN0, cM0, G2, ER+, PR+, HER2-) - Signed by Alla Feeling, NP on 02/02/2021 Nuclear grade: G2 Histologic grading system: 3 grade system   09/28/2020 - 10/27/2020 Radiation Therapy   Adjuvant Radiation with Dr Isidore Moos    11/2020 -  Anti-estrogen oral therapy   Anastrozole 17m daily starting 11/2020    02/03/2021 Survivorship   SCP delivered virtually by LCira Rue NP      INTERVAL HISTORY:  Ms. MBeasleyto review her survivorship care plan detailing her treatment course for breast cancer, as well as monitoring long-term side effects of that treatment, education regarding health maintenance, screening, and overall wellness and health promotion.     Overall, Ms. MCisarreports feeling quite well since completing her radiation therapy approximately 3 months ago.  Her skin color is "99% back to normal."  Except the right nipple is a little more darker and red than the other.  Denies warmth, edema, fever or chills.  She has occasional pain in the right upper breast after working outside such as digging which she did yesterday and has some soreness today.  She called in February and was told to stop AI because she was having elevated blood pressure, signs of UTI, neuropathy and pain in the left arm, bone aches, popping in her jaw, depression, and weight gain.  She prefers to stay off antiestrogen therapy.  She has no other new complaints.  ONCOLOGY TREATMENT TEAM:  1. Surgeon[facility]  [facility] 2. Medical Oncologist: Dr. FBurr Medico3. Radiation Oncologist: Dr. SIsidore Moos   PAST MEDICAL/SURGICAL HISTORY:  Past Medical History:  Diagnosis Date  . Allergy   . Arthritis   . Breast cancer ([place]    "right"  . Cataract   . Complication of anesthesia    per patient, for colonoscopy it was hard to wake up, but for previous surgery no problems waking   . GERD (gastroesophageal reflux disease)   . Osteoporosis    Past Surgical History:   Procedure Laterality Date  . BREAST BIOPSY Right 07/23/2020   [place]x, Vision clip, path pending   . BREAST LUMPECTOMY WITH RADIOACTIVE SEED AND SENTINEL LYMPH NODE BIOPSY Right 08/21/2020   Procedure: RIGHT BREAST LUMPECTOMY WITH RADIOACTIVE SEED AND RIGHT AXILLARY SENTINEL LYMPH NODE BIOPSY;  Surgeon: WRolm Bookbinder MD;  [facility]  Service: General;  Laterality: Right;  PEC BLOCK  . CATARACT EXTRACTION W/ INTRAOCULAR LENS  IMPLANT, BILATERAL Bilateral   . COLONOSCOPY    . TUBAL LIGATION       ALLERGIES:  No Known Allergies   CURRENT MEDICATIONS:  Outpatient Encounter Medications as of 02/03/2021  Medication Sig  . acetaminophen (TYLENOL) 500 MG tablet Take 500 mg by mouth every 6 (six) hours as needed.  .Marland Kitchenanastrozole (ARIMIDEX) 1 MG tablet  Take 1 tablet (1 mg total) by mouth daily.  . APPLE CIDER VINEGAR PO Take 2 tablets by mouth daily.  . Biotin w/ Vitamins C & E (HAIR/SKIN/NAILS PO) Take 1 tablet by mouth daily.  . Calcium Citrate (CITRACAL PO) Take 1 tablet by mouth daily.  . cholecalciferol (VITAMIN D) 1000 units tablet Take 1,000 Units daily by mouth.  . DULoxetine (CYMBALTA) 20 MG capsule Take 20 mg by mouth daily.  . fluticasone (FLONASE) 50 MCG/ACT nasal spray Place 2 sprays into both nostrils daily as needed for allergies.   . Multiple Vitamin (MULTIVITAMIN) tablet Take 1 tablet daily by mouth.  . Omega-3 Fatty Acids (FISH OIL OMEGA-3) 1000 MG CAPS Take 1,000 mg by mouth daily.   . traMADol (ULTRAM) 50 MG tablet Take 2 tablets (100 mg total) by mouth every 6 (six) hours as needed. (Patient not taking: Reported on 09/08/2020)  . vitamin B-12 (CYANOCOBALAMIN) 1000 MCG tablet Take 1,000 mcg by mouth daily.   No facility-administered encounter medications on file as of 02/03/2021.     ONCOLOGIC FAMILY HISTORY:  Family History  Problem Relation Age of Onset  . Cancer Mother        skin cancer  . Cancer Niece 2       breast cancer   . Breast cancer Neg Hx    . Colon cancer Neg Hx   . Esophageal cancer Neg Hx   . Rectal cancer Neg Hx   . Stomach cancer Neg Hx      GENETIC COUNSELING/TESTING: None  SOCIAL HISTORY:  Social History   Socioeconomic History  . Marital status: Married    Spouse name: Not on file  . Number of children: 2  . Years of education: Not on file  . Highest education level: Not on file  Occupational History  . Occupation: retired  Tobacco Use  . Smoking status: Never Smoker  . Smokeless tobacco: Never Used  Vaping Use  . Vaping Use: Never used  Substance and Sexual Activity  . Alcohol use: No  . Drug use: No  . Sexual activity: Not Currently  Other Topics Concern  . Not on file  Social History Narrative  . Not on file   Social Determinants of Health   Financial Resource Strain: Not on file  Food Insecurity: Not on file  Transportation Needs: Not on file  Physical Activity: Not on file  Stress: Not on file  Social Connections: Not on file  Intimate Partner Violence: Not on file     OBSERVATIONS/OBJECTIVE:  Patient appears well over the phone.  Voice is strong, speech is clear.  Mood/affect appear normal.  No cough or conversational dyspnea.  LABORATORY DATA:  None for this visit.  DIAGNOSTIC IMAGING:  None for this visit.      ASSESSMENT AND PLAN:  Jean Schwartz is a pleasant 75 y.o. female with Stage 1A right breast invasive ductal carcinoma, ER+/PR+/HER2-, diagnosed in 07/2020, treated with lumpectomy, adjuvant radiation therapy, and anti-estrogen therapy with anastrozole beginning in 11/2020.  She presents to the Survivorship Clinic for our initial meeting and routine follow-up post-completion of treatment for breast cancer.    1. Stage 1A right breast cancer:  Jean Schwartz has recovered well from definitive treatment for breast cancer. She will follow-up with her medical oncologist, Dr. Burr Medico in 04/2021 with history and physical exam per surveillance protocol.  She discontinued AI with  anastrozole in 12/2020 due to multiple side effects.  Her mammogram is due 07/2021, order placed today.  Her breast density is category C.  Given her dense breast tissue, history of breast cancer, and intolerance to AI she may benefit from additional screening breast MRI. Today, a comprehensive survivorship care plan and treatment summary was reviewed with the patient today detailing her breast cancer diagnosis, treatment course, potential late/long-term effects of treatment, appropriate follow-up care with recommendations for the future, and patient education resources.  A copy of this summary, along with a letter will be sent to the patient's primary care provider via In Basket message after today's visit.    2.  AI side effects jaw popping/stiffness, bone aches, neuropathy, hypertension, UTI, depression, weight gain: She was on anastrozole for 1 month, stopped in 12/2020 due to the above side effects.  We reviewed her Oncotype recurrence score which is 10, incurring a 3% risk of distant recurrence outside the breast in 9 years with antiestrogen alone, she has low risk disease.  I encouraged her to consider alternative medications such as exemestane or tamoxifen and reviewed the potential side effect profile.  She feels the overall benefit is not enough to risk having side effects and prefers to stay off antiestrogen therapy.  3. Bone health:  Given Jean Schwartz age/history of breast cancer and history of osteoporosis, she is at risk for further bone demineralization.  Her last DEXA scan [facility] showed osteoporosis, I do not have the report.  She notes that she has a repeat scan coming up this Spring and prefers to hold off on Zometa until then was. In the meantime, she was encouraged to increase her consumption of foods rich in calcium, as well as increase her weight-bearing activities.  She was given education on specific activities to promote bone health.  4. Cancer screening:  Due to Jean Schwartz history and her  age, she should receive screening for skin cancers, colon cancer, and gynecologic cancers.  The information and recommendations are listed on the patient's comprehensive care plan/treatment summary and were reviewed in detail with the patient.    5. Health maintenance and wellness promotion: Jean Schwartz was encouraged to consume 5-7 servings of fruits and vegetables per day. We reviewed the "Nutrition Rainbow" handout, as well as the handout "Take Control of Your Health and Reduce Your Cancer Risk" from the [employer].  She was also encouraged to engage in moderate to vigorous exercise for 30 minutes per day most days of the week. We discussed the [employer] fitness program, which is designed for cancer survivors to help them become more physically fit after cancer treatments.  She was instructed to limit her alcohol consumption and continue to abstain from tobacco use.   6. Support services/counseling: It is not uncommon for this period of the patient's cancer care trajectory to be one of many emotions and stressors.  We discussed how this can be increasingly difficult during the times of quarantine and social distancing due to the COVID-19 pandemic.   She was given information regarding our available services and encouraged to contact me with any questions or for help enrolling in any of our support group/programs.    Follow up instructions:    -Return to cancer center 05/05/2021 as scheduled -Mammogram due in September/2022 (wants to be done at [place] [facility]) -Follow up with surgery as scheduled -She is welcome to return back to the Survivorship Clinic at any time; no additional follow-up needed at this time.  -Consider referral back to survivorship as a long-term survivor for continued surveillance The patient  was provided an opportunity to ask questions and all were answered. The patient agreed with the plan and demonstrated an understanding of the  instructions.  The patient was advised to call back or seek an in-person evaluation if the symptoms worsen or if the condition fails to improve as anticipated.   Orders Placed This Encounter  Procedures  . MM DIAG BREAST TOMO BILATERAL    Standing Status:   Future    Standing Expiration Date:   02/03/2022    Order Specific Question:   Reason for Exam (SYMPTOM  OR DIAGNOSIS REQUIRED)    Answer:   History of right breast cancer s/p lumpectomy and radiation 2021    Order Specific Question:   Preferred imaging location?    Answer:   [place]    I provided 20 minutes of non-face-to-face time during this encounter.   Alla Feeling, NP

## 2021-02-03 ENCOUNTER — Encounter: Payer: Self-pay | Admitting: Nurse Practitioner

## 2021-02-03 ENCOUNTER — Inpatient Hospital Stay: Payer: Medicare HMO | Attending: Hematology | Admitting: Nurse Practitioner

## 2021-02-03 DIAGNOSIS — C50511 Malignant neoplasm of lower-outer quadrant of right female breast: Secondary | ICD-10-CM | POA: Diagnosis not present

## 2021-02-03 DIAGNOSIS — Z170 Estrogen receptor positive status [ER+]: Secondary | ICD-10-CM | POA: Diagnosis not present

## 2021-02-04 ENCOUNTER — Telehealth: Payer: Self-pay | Admitting: Nurse Practitioner

## 2021-02-04 NOTE — Telephone Encounter (Signed)
Checked out appointment. No LOS notes needing to be scheduled. No changes made. 

## 2021-03-09 IMAGING — MG MM BREAST LOCALIZATION CLIP
4 series · 4 of 12 positions shown · non-contrast
Comparison: Previous exam(s).

CLINICAL DATA: Evaluate biopsy marker

EXAM:
DIAGNOSTIC RIGHT MAMMOGRAM POST ULTRASOUND BIOPSY

[R CC synth-2D]
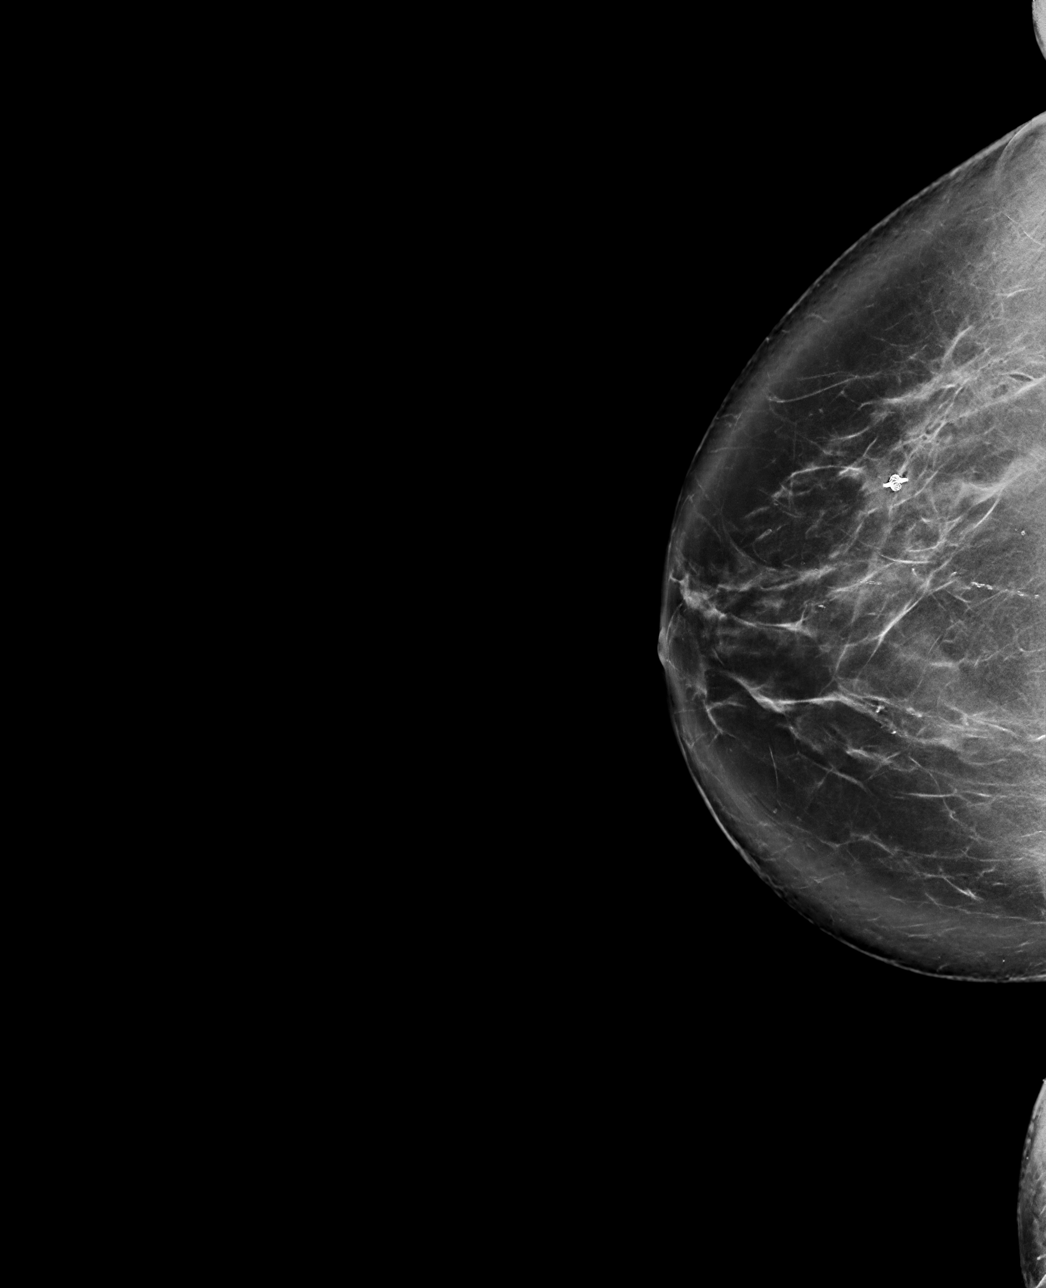

[R ML synth-2D]
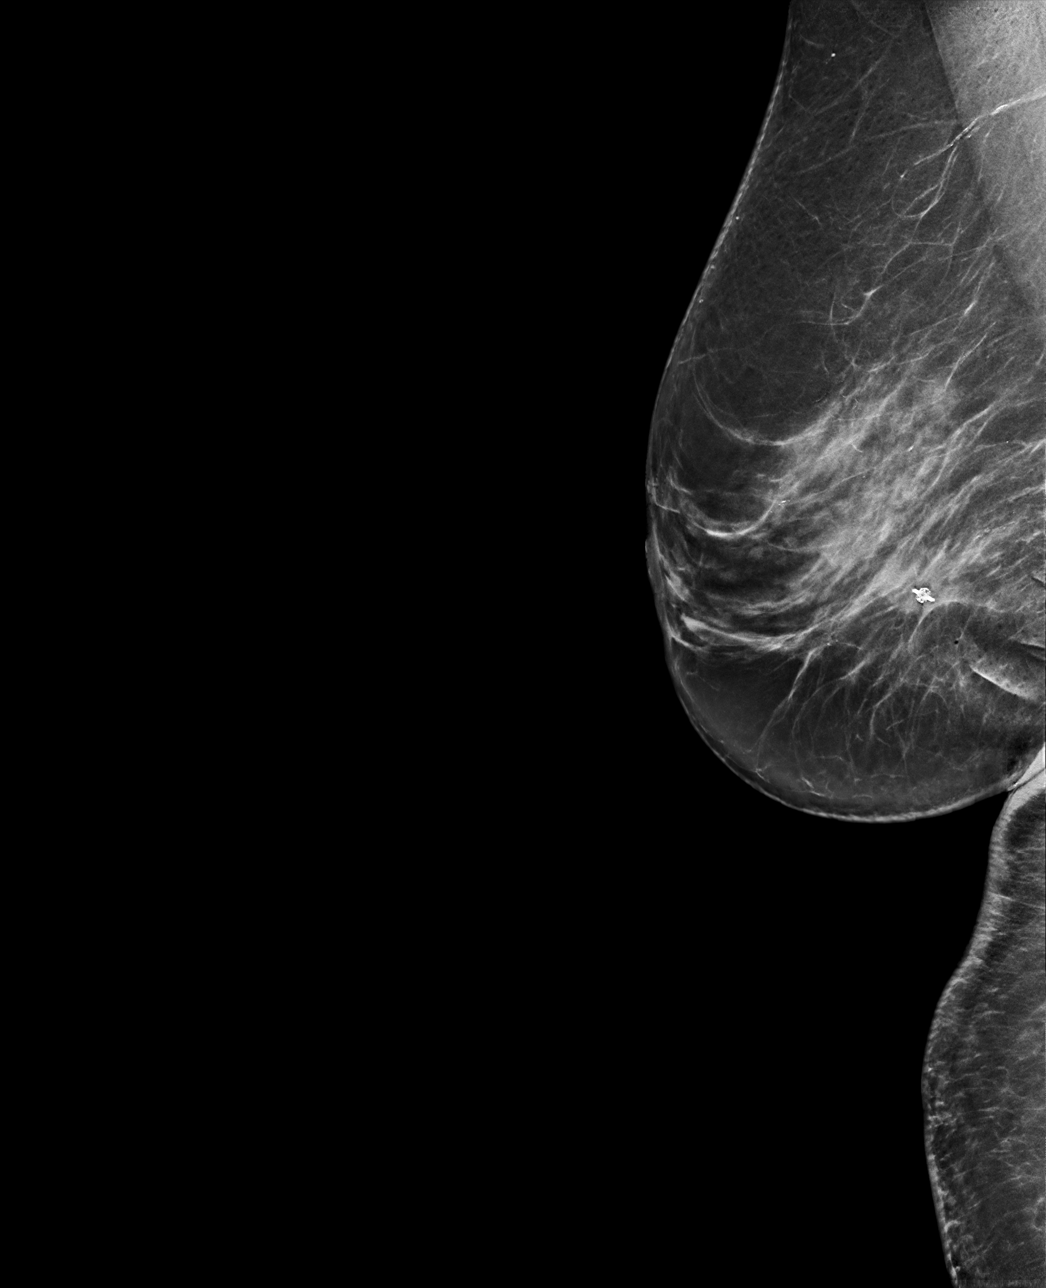

[R CC tomo · tomo slice 51/102.0]
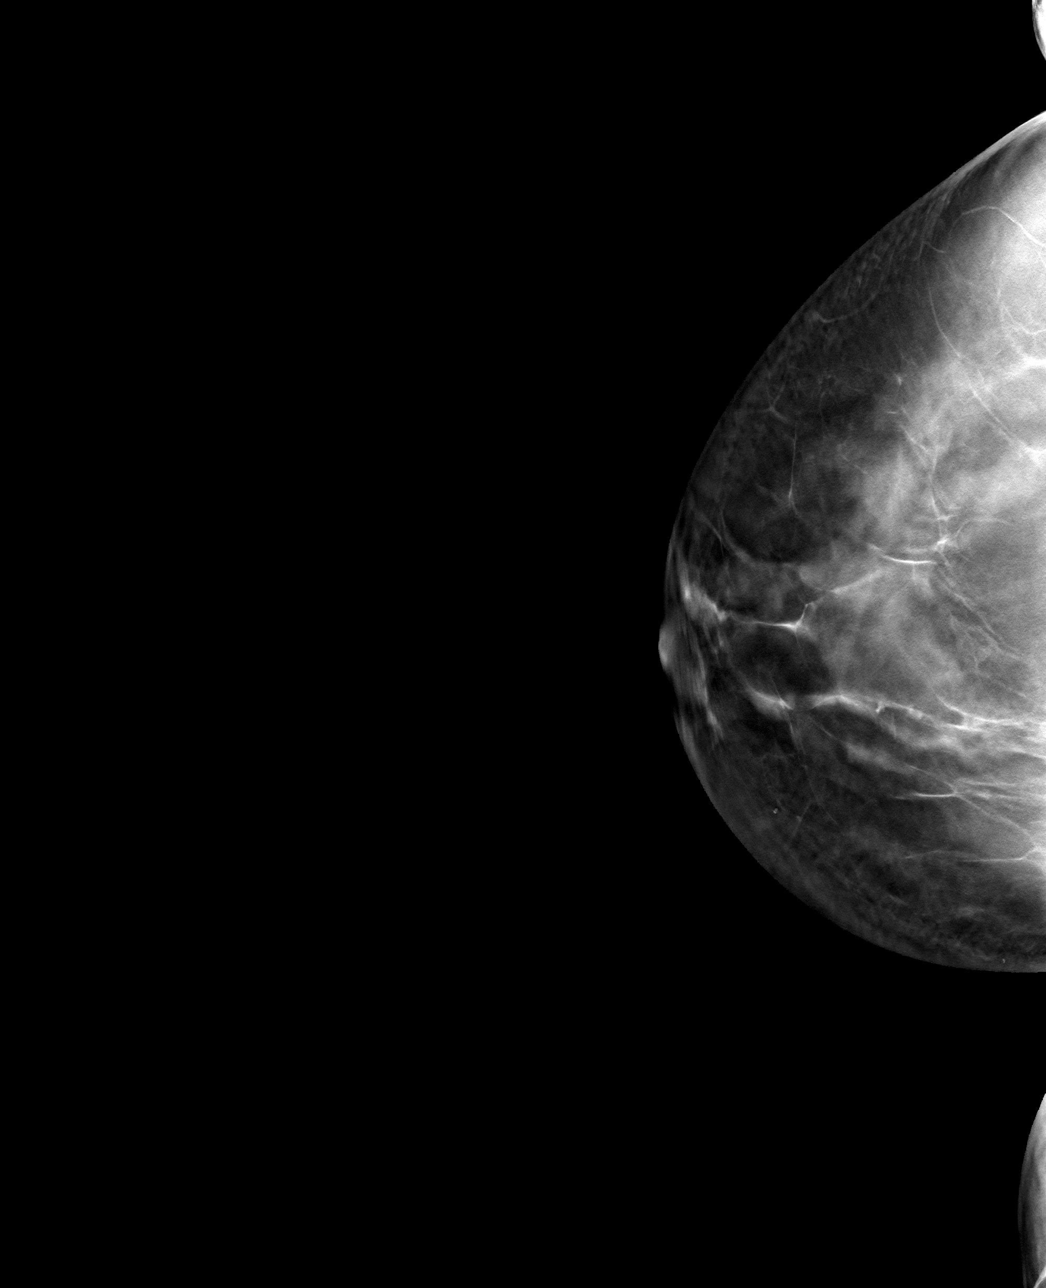

[R ML tomo · tomo slice 39/76.0]
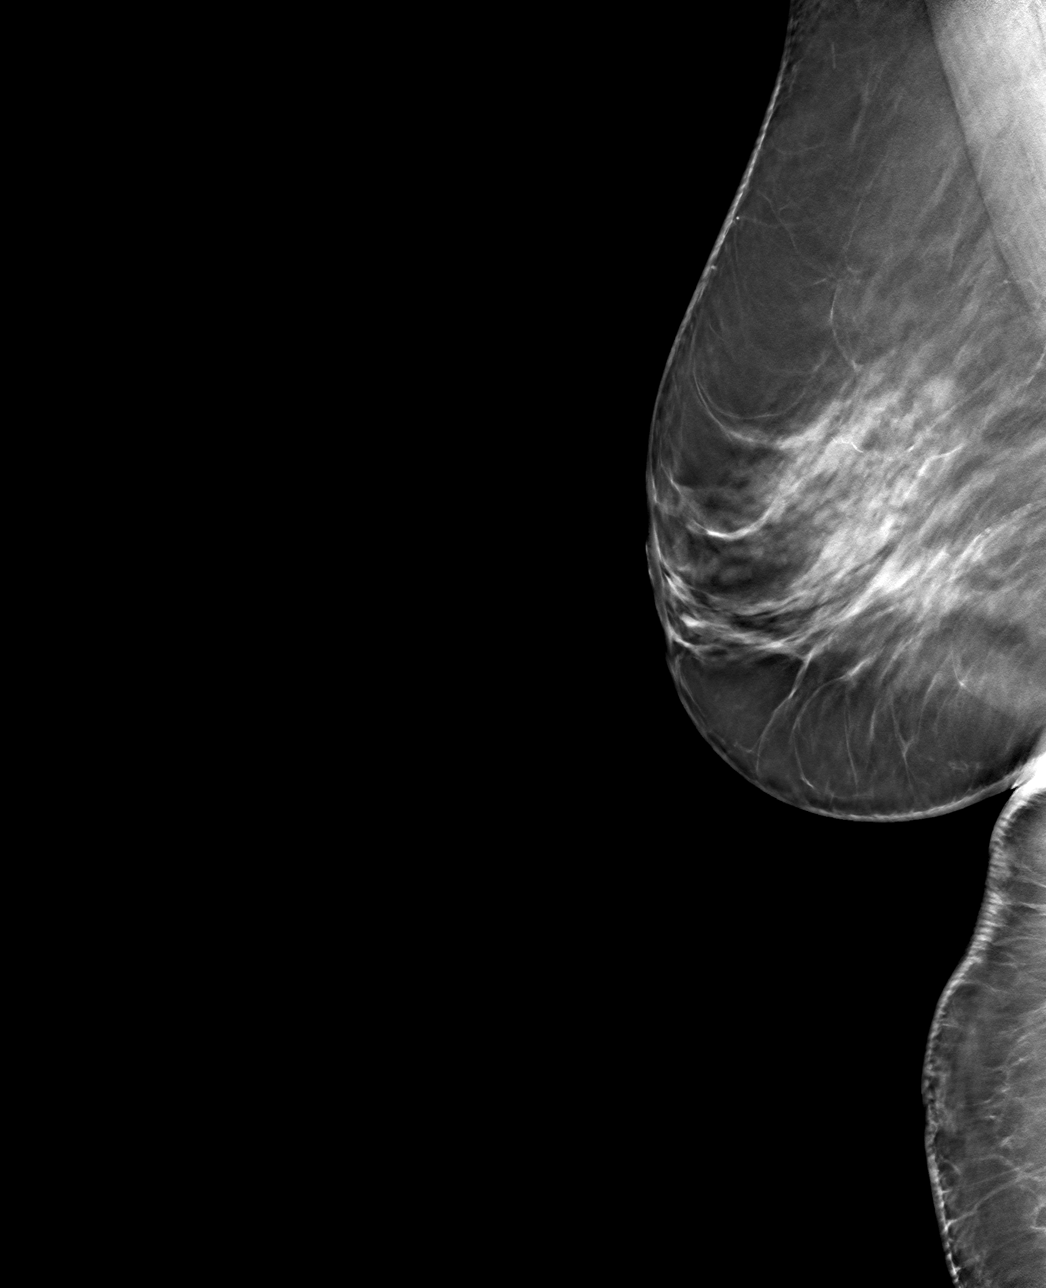

[4 of 12 positions shown; findings below may reference images not displayed]

FINDINGS: Mammographic images were obtained following ultrasound guided biopsy
of a right breast mass. The biopsy marking clip is in expected
position at the site of biopsy.
IMPRESSION: Appropriate positioning of the Q shaped biopsy marking clip at the
site of biopsy in the location of the biopsied breast mass.

Final Assessment: Post Procedure Mammograms for Marker Placement

## 2021-03-09 IMAGING — MG US  BREAST BX W/ LOC DEV 1ST LESION IMG BX SPEC US GUIDE*R*
1 series · 8 of 8 positions shown · non-contrast
Comparison: Previous exam(s).
COMPARISON: Previous exam(s).

Addendum:
CLINICAL DATA: Biopsy of a right breast mass at 8 o'clock

EXAM:
ULTRASOUND GUIDED RIGHT BREAST CORE NEEDLE BIOPSY

[Series 1: MG view · 0.06mm/px · 8 of 14 slices shown]
[im 1/14]
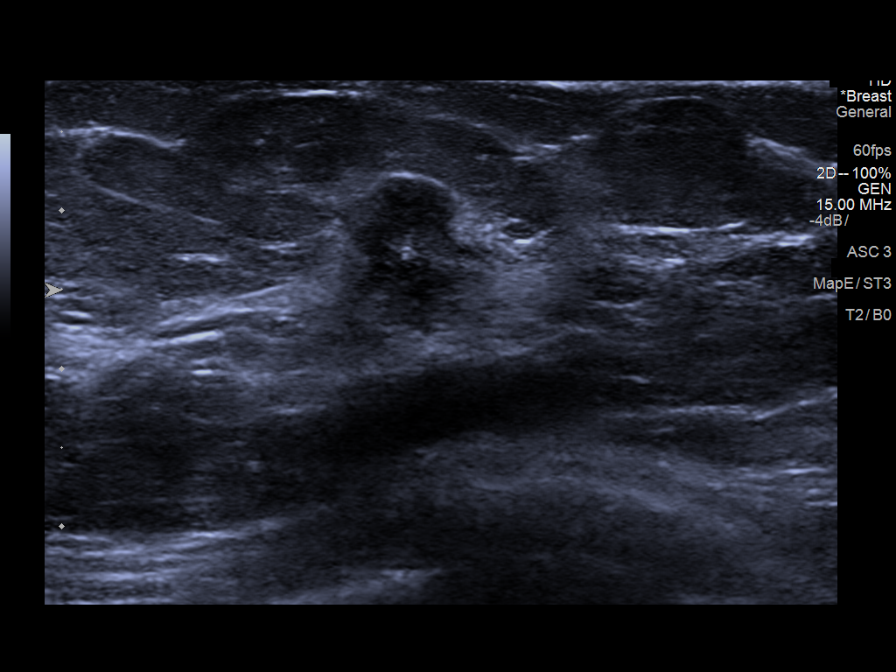
[im 2/14]
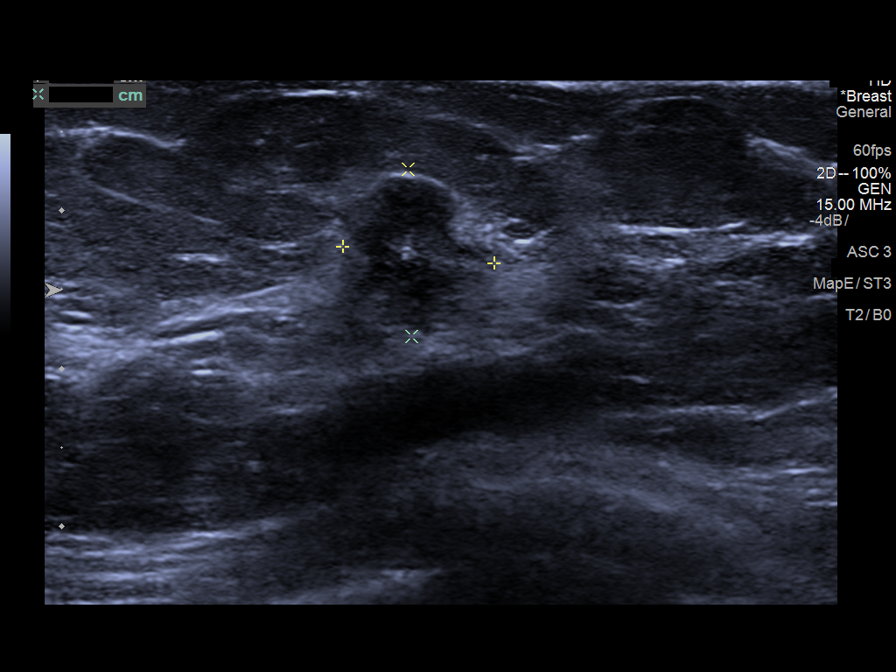
[im 4/14]
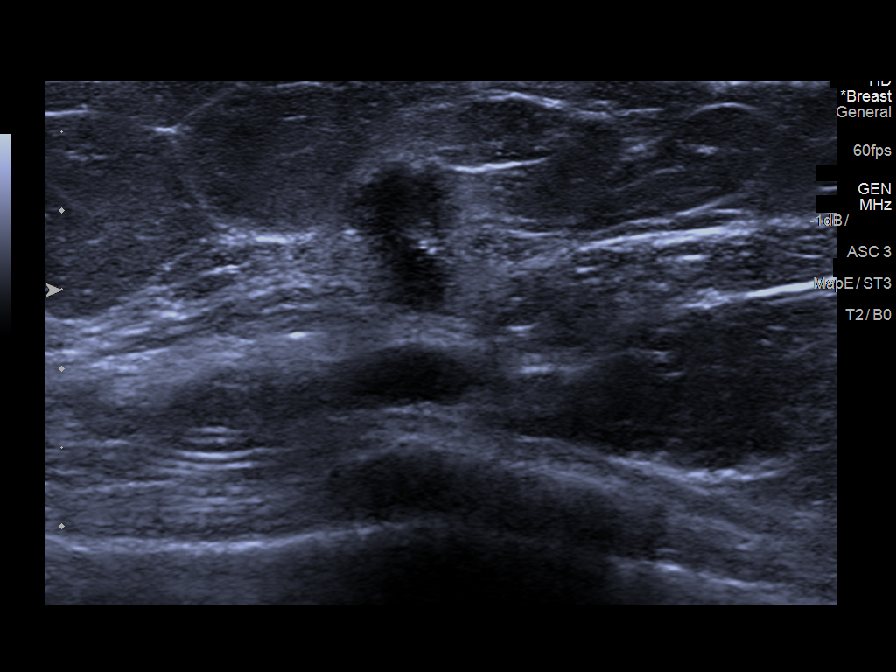
[im 6/14]
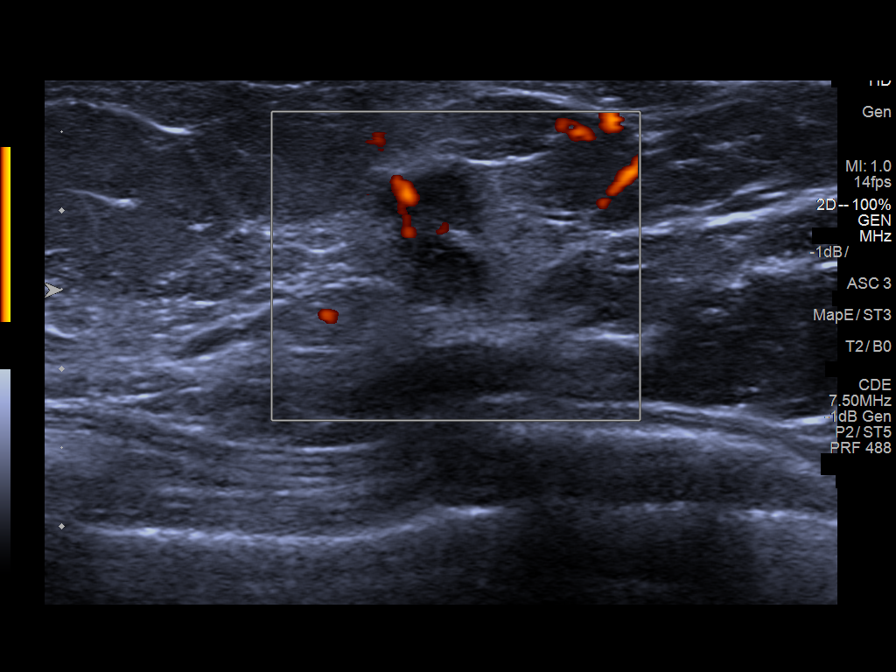
[im 8/14]
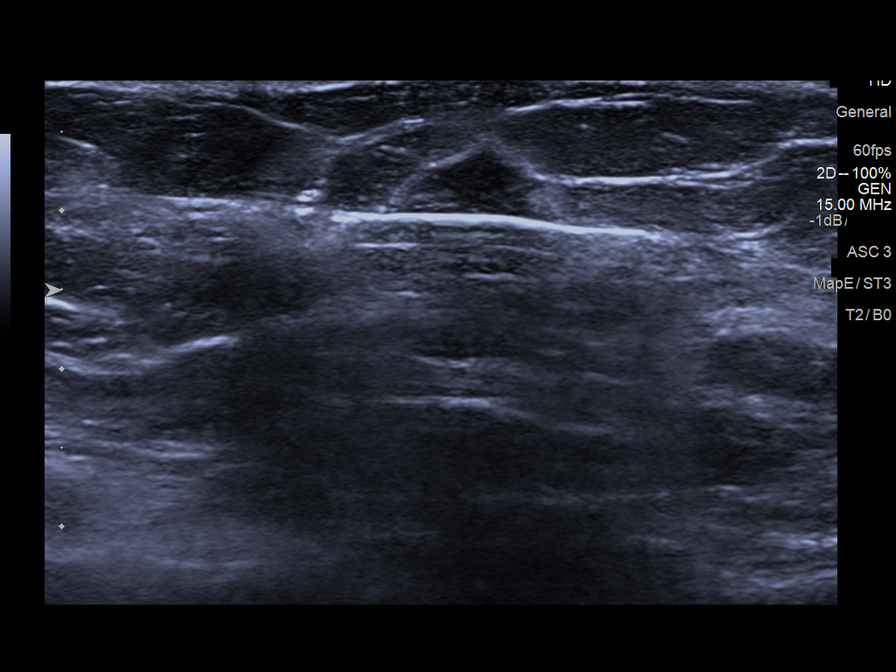
[im 10/14]
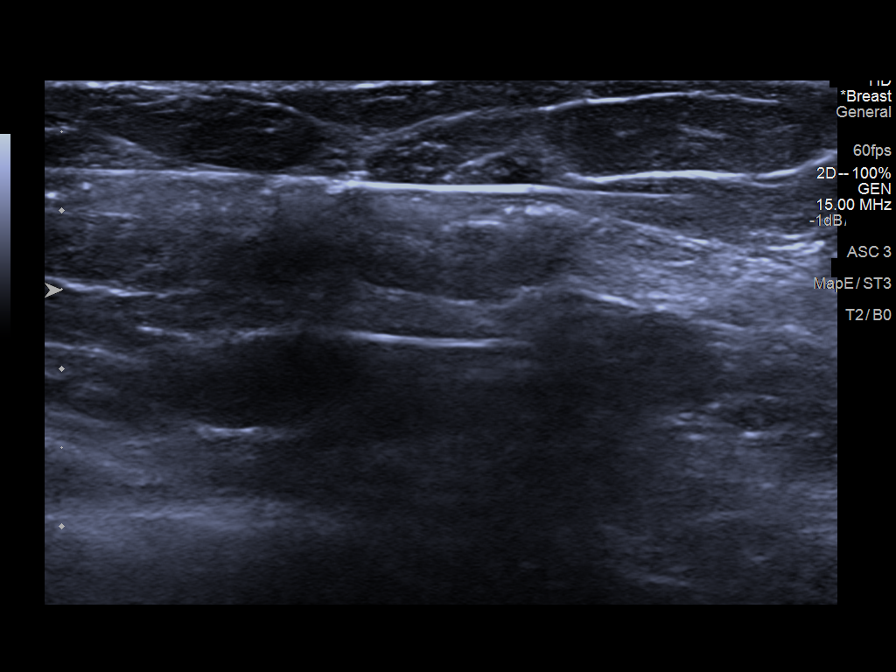
[im 12/14]
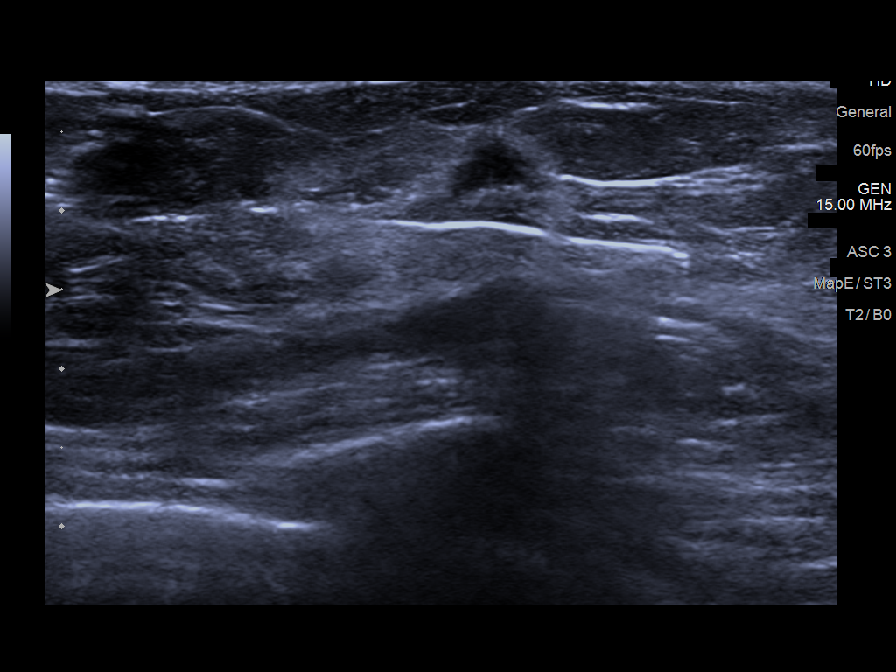
[im 14/14]
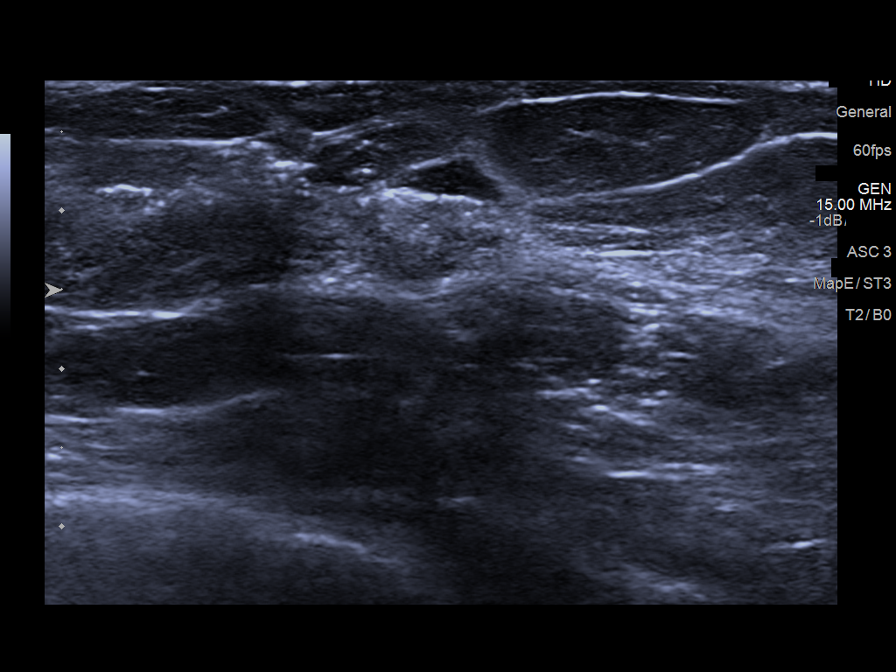

[8 of 8 positions shown; findings below may reference images not displayed]



Lesion quadrant: 8 o'clock

Using sterile technique and 1% Lidocaine as local anesthetic, under
direct ultrasound visualization, a 12 gauge Djukanovic device was
used to perform biopsy of an 8 o'clock right breast mass using a
lateral approach. At the conclusion of the procedure a Q shaped
tissue marker clip was deployed into the biopsy cavity. Follow up 2
view mammogram was performed and dictated separately.
IMPRESSION: Ultrasound guided biopsy of a right breast mass at 8 o'clock. No
apparent complications.

ADDENDUM:
PATHOLOGY revealed: A. BREAST, RIGHT [DATE] 3 CM FN; ULTRASOUND-GUIDED
BIOPSY: - INVASIVE MAMMARY CARCINOMA, NO SPECIAL TYPE. 11 mm in this
sample. Grade 2. Ductal carcinoma in situ: Not identified.
Lymphovascular invasion: Not identified.

Pathology results are CONCORDANT with imaging findings, per Dr.
Jihan Emox.

Pathology results and recommendations below were discussed with
patient by telephone on 07/24/2020. Patient reported biopsy site
within normal limits with slight tenderness at the site. Post biopsy
care instructions were reviewed, questions were answered and my
direct phone number was provided to patient. Patient was instructed
to call [HOSPITAL] if any concerns or questions arise
related to the biopsy.

Recommendation: Request for surgical consultation was relayed to
Ansu Honeycutt RN and Ing Louis Noleus RN at [HOSPITAL] [REDACTED] by Saju Macri RN on 07/24/2020.

Pathology results reported by Saju Macri RN on 07/24/2020.



Lesion quadrant: 8 o'clock

Using sterile technique and 1% Lidocaine as local anesthetic, under
direct ultrasound visualization, a 12 gauge Djukanovic device was
used to perform biopsy of an 8 o'clock right breast mass using a
lateral approach. At the conclusion of the procedure a Q shaped
tissue marker clip was deployed into the biopsy cavity. Follow up 2
view mammogram was performed and dictated separately.
IMPRESSION: Ultrasound guided biopsy of a right breast mass at 8 o'clock. No
apparent complications.

## 2021-04-01 DIAGNOSIS — I10 Essential (primary) hypertension: Secondary | ICD-10-CM | POA: Diagnosis not present

## 2021-04-01 DIAGNOSIS — R7989 Other specified abnormal findings of blood chemistry: Secondary | ICD-10-CM | POA: Diagnosis not present

## 2021-04-01 DIAGNOSIS — Z79899 Other long term (current) drug therapy: Secondary | ICD-10-CM | POA: Diagnosis not present

## 2021-04-01 DIAGNOSIS — E7800 Pure hypercholesterolemia, unspecified: Secondary | ICD-10-CM | POA: Diagnosis not present

## 2021-04-01 DIAGNOSIS — M8588 Other specified disorders of bone density and structure, other site: Secondary | ICD-10-CM | POA: Diagnosis not present

## 2021-04-08 DIAGNOSIS — E7800 Pure hypercholesterolemia, unspecified: Secondary | ICD-10-CM | POA: Diagnosis not present

## 2021-04-08 DIAGNOSIS — I10 Essential (primary) hypertension: Secondary | ICD-10-CM | POA: Diagnosis not present

## 2021-04-08 DIAGNOSIS — Z853 Personal history of malignant neoplasm of breast: Secondary | ICD-10-CM | POA: Diagnosis not present

## 2021-04-30 NOTE — Progress Notes (Signed)
[facility]   Telephone:([phone] Fax:([phone]   Clinic Follow up Note   Patient Care Team: Idelle Crouch, MD as PCP - General (Internal Medicine) Mauro Kaufmann, RN as Oncology Nurse Navigator Rockwell Germany, RN as Oncology Nurse Navigator Truitt Merle, MD as Consulting Physician (Hematology) Rolm Bookbinder, MD as Consulting Physician (General Surgery) Eppie Gibson, MD as Attending Physician (Radiation Oncology) Alla Feeling, NP as Nurse Practitioner (Nurse Practitioner)  Date of Service:  05/05/2021  CHIEF COMPLAINT: F/u of right breast cancer   SUMMARY OF ONCOLOGIC HISTORY: Oncology History Overview Note  Cancer Staging Malignant neoplasm of lower-outer quadrant of right breast of female, estrogen receptor positive ([place]) Staging form: Breast, AJCC 8th Edition - Clinical stage from 07/23/2020: Stage IA (cT1b, cN0, cM0, G2, ER+, PR+, HER2-) - Signed by Truitt Merle, MD on 08/10/2020    Malignant neoplasm of lower-outer quadrant of right breast of female, estrogen receptor positive ([place])  07/14/2020 Mammogram   Diagnostic Mammogram 07/14/20  IMPRESSION: 1.0 x 1.0 x 0.9cm mass in the 8 o'clock position of the right breast 3cmfn with imaging features suspicious for malignancy.   07/23/2020 Cancer Staging   Staging form: Breast, AJCC 8th Edition - Clinical stage from 07/23/2020: Stage IA (cT1b, cN0, cM0, G2, ER+, PR+, HER2-) - Signed by Truitt Merle, MD on 08/10/2020    07/23/2020 Initial Biopsy   DIAGNOSIS: 07/23/20 A. BREAST, RIGHT 8:00 3 CM FN; ULTRASOUND-GUIDED BIOPSY:  - INVASIVE MAMMARY CARCINOMA, NO SPECIAL TYPE.    07/23/2020 Receptors her2   ADDENDUM:  BREAST BIOMARKER TESTS  Estrogen Receptor (ER) Status: Positive (greater than 10% of cells  demonstrate nuclear positivity)                       Percentage of cells with nuclear positivity:  91-100%                       Average intensity of staining: Strong   Progesterone Receptor (PgR) Status:  Positive (greater than 10% of cells  demonstrate nuclear positivity)                       Percentage of cells with nuclear positivity: 51-90%                       Average intensity of staining: Moderate   HER2 (by immunohistochemistry): Negative (Score 0)    08/10/2020 Initial Diagnosis   Malignant neoplasm of lower-outer quadrant of right breast of female, estrogen receptor positive ([place])    08/21/2020 Surgery   RIGHT BREAST LUMPECTOMY WITH RADIOACTIVE SEED AND RIGHT AXILLARY SENTINEL LYMPH NODE BIOPSY by Dr Donne Hazel     08/21/2020 Pathology Results   FINAL MICROSCOPIC DIAGNOSIS:   A. 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  - Low grade ductal carcinoma in situ.  - Invasive carcinoma is <1 mm from the lateral margin broadly, see  comment.  - Margins are negative for in situ carcinoma.  - See oncology table.   B. LYMPH NODE, RIGHT AXILLA, SENTINEL, EXCISION:  - One of one lymph nodes negative for carcinoma (0/1).   C. BREAST, RIGHT ADDITIONAL MEDIAL MARGIN, EXCISION:  - Benign breast tissue.   D. BREAST, RIGHT ADDITIONAL SUPERIOR MARGIN, EXCISION:  - Benign breast tissue.   E. BREAST, RIGHT ADDITIONAL ANTERIOR MARGIN, EXCISION:  - Benign breast tissue.   F.  BREAST, RIGHT ADDITIONAL POSTERIOR MARGIN, EXCISION:  - Benign breast tissue.    08/21/2020 Oncotype testing   Oncotype  Recurrence Score 10 with distant recurrence risk at 9 years at 3% with AI or Tamoxifen alone  Less than 1% benefit of Adjuvant chemothreapy.     08/21/2020 Cancer Staging   Staging form: Breast, AJCC 8th Edition - Pathologic stage from 08/21/2020: Stage IA (pT1c, pN0, cM0, G2, ER+, PR+, HER2-) - Signed by Alla Feeling, NP on 02/02/2021  Nuclear grade: G2  Histologic grading system: 3 grade system    09/28/2020 - 10/27/2020 Radiation Therapy   Adjuvant Radiation with Dr Isidore Moos    11/2020 - 12/2020 Anti-estrogen oral therapy   Anastrozole 1mg  daily  starting 11/2020, stopped after 1 months due to joint, bone pain, sadness, and other side effects. She is not interested in any other Antiestrogen therapy   02/03/2021 Survivorship   SCP delivered virtually by Cira Rue, NP       CURRENT THERAPY:  Surveillance   INTERVAL HISTORY:  Jean Schwartz is here for a follow up of right breast cancer. She was last seen by me 6 months ago and seen by NP Lacie 3 months ago in interim. She presents to the clinic alone. I reviewed her medication list with her. She notes she was recently started on HTN medication, possibly 5mg  Amlodipine. She is not taking Cymbalta or Tramadol. She notes she has had her DEXA with her PCP in early 2022 and had osteopenia now. She is not sure if she had osteoporosis.  She notes she tried anastrozole for only 1 month and stopped due to elevated BP, numbness in her left arm and bone pain. She also notes mood swings with sadness. She denies hot flashes. She notes she feels better off Anastrozole. She is not interested in trying any other Antiestrogen therapy.      REVIEW OF SYSTEMS:   Constitutional: Denies fevers, chills or abnormal weight loss Eyes: Denies blurriness of vision Ears, nose, mouth, throat, and face: Denies mucositis or sore throat Respiratory: Denies cough, dyspnea or wheezes Cardiovascular: Denies palpitation, chest discomfort or lower extremity swelling Gastrointestinal:  Denies nausea, heartburn or change in bowel habits Skin: Denies abnormal skin rashes Lymphatics: Denies new lymphadenopathy or easy bruising Neurological:Denies numbness, tingling or new weaknesses Behavioral/Psych: Mood is stable, no new changes  All other systems were reviewed with the patient and are negative.  MEDICAL HISTORY:  Past Medical History:  Diagnosis Date   Allergy    Arthritis    Breast cancer ([place])    "right"   Cataract    Complication of anesthesia    per patient, for colonoscopy it was hard to wake up,  but for previous surgery no problems waking    GERD (gastroesophageal reflux disease)    Osteoporosis     SURGICAL HISTORY: Past Surgical History:  Procedure Laterality Date   BREAST BIOPSY Right 07/23/2020   [place] Bx, Vision clip, path pending    BREAST LUMPECTOMY WITH RADIOACTIVE SEED AND SENTINEL LYMPH NODE BIOPSY Right 08/21/2020   Procedure: RIGHT BREAST LUMPECTOMY WITH RADIOACTIVE SEED AND RIGHT AXILLARY SENTINEL LYMPH NODE BIOPSY;  Surgeon: Rolm Bookbinder, MD;  Location: [facility];  Service: General;  Laterality: Right;  PEC BLOCK   CATARACT EXTRACTION W/ INTRAOCULAR LENS  IMPLANT, BILATERAL Bilateral    COLONOSCOPY     TUBAL LIGATION      I have reviewed the social history and family history with the patient and they are  unchanged from previous note.  ALLERGIES:  has No Known Allergies.  MEDICATIONS:  Current Outpatient Medications  Medication Sig Dispense Refill   amLODipine (NORVASC) 5 MG tablet Take 1 tablet by mouth daily.     APPLE CIDER VINEGAR PO Take 2 tablets by mouth daily.     Biotin w/ Vitamins C & E (HAIR/SKIN/NAILS PO) Take 1 tablet by mouth daily.     Calcium Citrate (CITRACAL PO) Take 1 tablet by mouth daily.     cholecalciferol (VITAMIN D) 1000 units tablet Take 1,000 Units daily by mouth.     fluticasone (FLONASE) 50 MCG/ACT nasal spray Place 2 sprays into both nostrils daily as needed for allergies.      Multiple Vitamin (MULTIVITAMIN) tablet Take 1 tablet daily by mouth.     Omega-3 Fatty Acids (FISH OIL OMEGA-3) 1000 MG CAPS Take 1,000 mg by mouth daily.      vitamin B-12 (CYANOCOBALAMIN) 1000 MCG tablet Take 1,000 mcg by mouth daily.     No current facility-administered medications for this visit.    PHYSICAL EXAMINATION: ECOG PERFORMANCE STATUS: 0 - Asymptomatic  Vitals:   05/05/21 0959  BP: 140/61  Pulse: (!) 55  Resp: 16  Temp: 97.6 F (36.4 C)  SpO2: 100%   Filed Weights   05/05/21 0959  Weight: 127 lb 4.8 oz (57.7 kg)     GENERAL:alert, no distress and comfortable SKIN: skin color, texture, turgor are normal, no rashes or significant lesions EYES: normal, Conjunctiva are pink and non-injected, sclera clear  NECK: supple, thyroid normal size, non-tender, without nodularity LYMPH:  no palpable lymphadenopathy in the cervical, axillary  LUNGS: clear to auscultation and percussion with normal breathing effort HEART: regular rate & rhythm and no murmurs and no lower extremity edema ABDOMEN:abdomen soft, non-tender and normal bowel sounds Musculoskeletal:no cyanosis of digits and no clubbing  NEURO: alert & oriented x 3 with fluent speech, no focal motor/sensory deficits BREAST: S/p right lumpectomy: Surgical incision healed well. No palpable mass, nodules or adenopathy bilaterally. Breast exam benign.   LABORATORY DATA:  I have reviewed the data as listed CBC Latest Ref Rng & Units 05/05/2021 08/10/2020 10/03/2017  WBC 4.0 - 10.5 K/uL 4.4 4.9 4.5  Hemoglobin 12.0 - 15.0 g/dL 13.4 13.7 13.0  Hematocrit 36.0 - 46.0 % 39.5 40.9 39.0  Platelets 150 - 400 K/uL 157 173 173     CMP Latest Ref Rng & Units 05/05/2021 08/10/2020 10/03/2017  Glucose 70 - 99 mg/dL 93 88 106(H)  BUN 8 - 23 mg/dL _0 Creatinine 0.44 - 1.00 mg/dL 0.75 0.78 0.86  Sodium 135 - 145 mmol/L 141 142 138  Potassium 3.5 - 5.1 mmol/L 4.6 4.8 3.9  Chloride 98 - 111 mmol/L 109 108 105  CO2 22 - 32 mmol/L _1 Calcium 8.9 - 10.3 mg/dL 9.6 9.7 8.9  Total Protein 6.5 - 8.1 g/dL 6.5 6.7 -  Total Bilirubin 0.3 - 1.2 mg/dL 0.6 0.6 -  Alkaline Phos 38 - 126 U/L 68 65 -  AST 15 - 41 U/L 26 24 -  ALT 0 - 44 U/L 19 22 -      RADIOGRAPHIC STUDIES: I have personally reviewed the radiological images as listed and agreed with the findings in the report. No results found.   ASSESSMENT & PLAN:  Jean Schwartz is a 75 y.o. female with    1. 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 She underwent right breast lumpectomy and SLNB by Dr Donne Hazel on 08/21/20. Her Oncotype showed low risk with RS 10. She did complete adjuvant radiation with Dr Isidore Moos  -I started her on anastrozole in 11/2020. She stopped after 1 month due to joint, bone pain, sadness, and other side effects. Given her risk of breast cancer recurrence, I discussed the option of trying Tamoxifen which has less effects on her joints and symptom management. She declined trying any other antiestrogen therapy given concern for possible side effects.  -She is clinically doing well. Lab reviewed, her CBC and CMP are within normal limits. Her physical exam and was unremarkable. There is no clinical concern for recurrence. -Will continue with breast surveillance, next mammogram in 06/2021.  -F/u in 6 months with NP Lacie and f/u with me in 1 year.      2. Osteoporosis -She has DEXA scans with her PCP. I will request 2022 and prior DEXA results. Per patient her 2022 DEXA showed osteopenia. She previously noted years ago she tried Fosamax but did not tolerate well due to N&V. -I discussed for osteoporosis, she has the option of treatment with Zometa infusions q35month for 2 years which can strengthen her bone and reduce risk of bone metastasis from future breast cancer. I reviewed side effects with her. She declined Zometa, unless she had osteoporosis.  -Continue Calcium and Vit D.       PLAN:  -pt declined other antiestrogen therapy, will continue cancer surveillance  -Mammogram in 06/2021.  -Request 2022 and prior DEXA scan results from PCP office.  -Lab and F/u with NP Laice in 6 months    No problem-specific Assessment & Plan notes found for this encounter.   No orders of the defined types were placed in this encounter.  All questions were answered. The patient knows to call the clinic with any problems, questions or concerns. No barriers to  learning was detected. The total time spent in the appointment was 30 minutes.     YTruitt Merle MD 05/05/2021   I, AJoslyn Devon am acting as scribe for YTruitt Merle MD.   I have reviewed the above documentation for accuracy and completeness, and I agree with the above.

## 2021-05-05 ENCOUNTER — Other Ambulatory Visit: Payer: Self-pay

## 2021-05-05 ENCOUNTER — Inpatient Hospital Stay: Payer: Medicare HMO | Attending: Hematology | Admitting: Hematology

## 2021-05-05 ENCOUNTER — Inpatient Hospital Stay: Payer: Medicare HMO

## 2021-05-05 ENCOUNTER — Encounter: Payer: Self-pay | Admitting: Hematology

## 2021-05-05 VITALS — BP 140/61 | HR 55 | Temp 97.6°F | Resp 16 | Ht 62.0 in | Wt 127.3 lb

## 2021-05-05 DIAGNOSIS — I10 Essential (primary) hypertension: Secondary | ICD-10-CM | POA: Diagnosis not present

## 2021-05-05 DIAGNOSIS — Z79899 Other long term (current) drug therapy: Secondary | ICD-10-CM | POA: Insufficient documentation

## 2021-05-05 DIAGNOSIS — Z853 Personal history of malignant neoplasm of breast: Secondary | ICD-10-CM | POA: Insufficient documentation

## 2021-05-05 DIAGNOSIS — C50511 Malignant neoplasm of lower-outer quadrant of right female breast: Secondary | ICD-10-CM | POA: Diagnosis not present

## 2021-05-05 DIAGNOSIS — Z170 Estrogen receptor positive status [ER+]: Secondary | ICD-10-CM

## 2021-05-05 DIAGNOSIS — K219 Gastro-esophageal reflux disease without esophagitis: Secondary | ICD-10-CM | POA: Insufficient documentation

## 2021-05-05 DIAGNOSIS — Z923 Personal history of irradiation: Secondary | ICD-10-CM | POA: Diagnosis not present

## 2021-05-05 LAB — CBC WITH DIFFERENTIAL (CANCER CENTER ONLY)
Abs Immature Granulocytes: 0.01 10*3/uL (ref 0.00–0.07)
Basophils Absolute: 0 10*3/uL (ref 0.0–0.1)
Basophils Relative: 1 %
Eosinophils Absolute: 0.3 10*3/uL (ref 0.0–0.5)
Eosinophils Relative: 7 %
HCT: 39.5 % (ref 36.0–46.0)
Hemoglobin: 13.4 g/dL (ref 12.0–15.0)
Immature Granulocytes: 0 %
Lymphocytes Relative: 20 %
Lymphs Abs: 0.9 10*3/uL (ref 0.7–4.0)
MCH: 29 pg (ref 26.0–34.0)
MCHC: 33.9 g/dL (ref 30.0–36.0)
MCV: 85.5 fL (ref 80.0–100.0)
Monocytes Absolute: 0.4 10*3/uL (ref 0.1–1.0)
Monocytes Relative: 9 %
Neutro Abs: 2.8 10*3/uL (ref 1.7–7.7)
Neutrophils Relative %: 63 %
Platelet Count: 157 10*3/uL (ref 150–400)
RBC: 4.62 MIL/uL (ref 3.87–5.11)
RDW: 12.6 % (ref 11.5–15.5)
WBC Count: 4.4 10*3/uL (ref 4.0–10.5)
nRBC: 0 % (ref 0.0–0.2)

## 2021-05-05 LAB — CMP (CANCER CENTER ONLY)
ALT: 19 U/L (ref 0–44)
AST: 26 U/L (ref 15–41)
Albumin: 4 g/dL (ref 3.5–5.0)
Alkaline Phosphatase: 68 U/L (ref 38–126)
Anion gap: 8 (ref 5–15)
BUN: 12 mg/dL (ref 8–23)
CO2: 24 mmol/L (ref 22–32)
Calcium: 9.6 mg/dL (ref 8.9–10.3)
Chloride: 109 mmol/L (ref 98–111)
Creatinine: 0.75 mg/dL (ref 0.44–1.00)
GFR, Estimated: >60 mL/min (ref >60)
Glucose, Bld: 93 mg/dL (ref 70–99)
Potassium: 4.6 mmol/L (ref 3.5–5.1)
Sodium: 141 mmol/L (ref 135–145)
Total Bilirubin: 0.6 mg/dL (ref 0.3–1.2)
Total Protein: 6.5 g/dL (ref 6.5–8.1)

## 2021-05-05 LAB — VITAMIN D 25 HYDROXY (VIT D DEFICIENCY, FRACTURES): Vit D, 25-Hydroxy: 51.77 ng/mL (ref 30–100)

## 2021-05-05 NOTE — Progress Notes (Signed)
Medication review

## 2021-05-06 ENCOUNTER — Telehealth: Payer: Self-pay | Admitting: Hematology

## 2021-05-06 NOTE — Telephone Encounter (Signed)
Scheduled follow-up appointment per 6/15 los. Patient is aware.

## 2021-05-13 DIAGNOSIS — L821 Other seborrheic keratosis: Secondary | ICD-10-CM | POA: Diagnosis not present

## 2021-05-13 DIAGNOSIS — D2271 Melanocytic nevi of right lower limb, including hip: Secondary | ICD-10-CM | POA: Diagnosis not present

## 2021-05-13 DIAGNOSIS — D225 Melanocytic nevi of trunk: Secondary | ICD-10-CM | POA: Diagnosis not present

## 2021-05-13 DIAGNOSIS — D2261 Melanocytic nevi of right upper limb, including shoulder: Secondary | ICD-10-CM | POA: Diagnosis not present

## 2021-05-13 DIAGNOSIS — D2262 Melanocytic nevi of left upper limb, including shoulder: Secondary | ICD-10-CM | POA: Diagnosis not present

## 2021-05-13 DIAGNOSIS — D2272 Melanocytic nevi of left lower limb, including hip: Secondary | ICD-10-CM | POA: Diagnosis not present

## 2021-05-31 ENCOUNTER — Other Ambulatory Visit: Payer: Self-pay | Admitting: Nurse Practitioner

## 2021-05-31 DIAGNOSIS — C50511 Malignant neoplasm of lower-outer quadrant of right female breast: Secondary | ICD-10-CM

## 2021-06-29 DIAGNOSIS — R6889 Other general symptoms and signs: Secondary | ICD-10-CM | POA: Diagnosis not present

## 2021-06-29 DIAGNOSIS — Z03818 Encounter for observation for suspected exposure to other biological agents ruled out: Secondary | ICD-10-CM | POA: Diagnosis not present

## 2021-06-30 ENCOUNTER — Inpatient Hospital Stay: Admission: RE | Admit: 2021-06-30 | Payer: Medicare HMO | Source: Ambulatory Visit

## 2021-06-30 ENCOUNTER — Other Ambulatory Visit: Payer: Medicare HMO

## 2021-07-07 ENCOUNTER — Ambulatory Visit
Admission: RE | Admit: 2021-07-07 | Discharge: 2021-07-07 | Disposition: A | Discharge status: Home / Self Care | Payer: Medicare HMO | Source: Ambulatory Visit | Attending: Nurse Practitioner | Admitting: Nurse Practitioner

## 2021-07-07 ENCOUNTER — Other Ambulatory Visit: Payer: Self-pay

## 2021-07-07 DIAGNOSIS — R922 Inconclusive mammogram: Secondary | ICD-10-CM | POA: Diagnosis not present

## 2021-07-07 DIAGNOSIS — Z170 Estrogen receptor positive status [ER+]: Secondary | ICD-10-CM | POA: Diagnosis not present

## 2021-07-07 DIAGNOSIS — C50511 Malignant neoplasm of lower-outer quadrant of right female breast: Secondary | ICD-10-CM | POA: Insufficient documentation

## 2021-07-07 HISTORY — DX: Personal history of irradiation: Z92.3

## 2021-07-30 DIAGNOSIS — C50919 Malignant neoplasm of unspecified site of unspecified female breast: Secondary | ICD-10-CM | POA: Diagnosis not present

## 2021-07-30 DIAGNOSIS — Z79899 Other long term (current) drug therapy: Secondary | ICD-10-CM | POA: Diagnosis not present

## 2021-07-30 DIAGNOSIS — I10 Essential (primary) hypertension: Secondary | ICD-10-CM | POA: Diagnosis not present

## 2021-10-05 DIAGNOSIS — L298 Other pruritus: Secondary | ICD-10-CM | POA: Diagnosis not present

## 2021-10-05 DIAGNOSIS — L820 Inflamed seborrheic keratosis: Secondary | ICD-10-CM | POA: Diagnosis not present

## 2021-10-29 ENCOUNTER — Telehealth: Payer: Self-pay | Admitting: Hematology

## 2021-10-29 NOTE — Telephone Encounter (Signed)
Rescheduled upcoming appointment per patient's request. Patient is aware of changes. 

## 2021-11-02 ENCOUNTER — Other Ambulatory Visit: Payer: Self-pay | Admitting: *Deleted

## 2021-11-02 DIAGNOSIS — C50511 Malignant neoplasm of lower-outer quadrant of right female breast: Secondary | ICD-10-CM

## 2021-11-02 DIAGNOSIS — Z170 Estrogen receptor positive status [ER+]: Secondary | ICD-10-CM

## 2021-11-02 NOTE — Progress Notes (Signed)
[facility]   [phone]) [phone] Fax:(336) [phone]   Clinic Follow up Note   Patient Care Team: Idelle Crouch, MD as PCP - General (Internal Medicine) Mauro Kaufmann, RN as Oncology Nurse Navigator Rockwell Germany, RN as Oncology Nurse Navigator Truitt Merle, MD as Consulting Physician (Hematology) Rolm Bookbinder, MD as Consulting Physician (General Surgery) Eppie Gibson, MD as Attending Physician (Radiation Oncology) Alla Feeling, NP as Nurse Practitioner (Nurse Practitioner) 11/03/2021  CHIEF COMPLAINT: Follow-up right breast cancer  SUMMARY OF ONCOLOGIC HISTORY: Oncology History Overview Note  Cancer Staging Malignant neoplasm of lower-outer quadrant of right breast of female, estrogen receptor positive ([place]) Staging form: Breast, AJCC 8th Edition - Clinical stage from 07/23/2020: Stage IA (cT1b, cN0, cM0, G2, ER+, PR+, HER2-) - Signed by Truitt Merle, MD on 08/10/2020    Malignant neoplasm of lower-outer quadrant of right breast of female, estrogen receptor positive ([place])  07/14/2020 Mammogram   Diagnostic Mammogram 07/14/20  IMPRESSION: 1.0 x 1.0 x 0.9cm mass in the 8 o'clock position of the right breast 3cmfn with imaging features suspicious for malignancy.   07/23/2020 Cancer Staging   Staging form: Breast, AJCC 8th Edition - Clinical stage from 07/23/2020: Stage IA (cT1b, cN0, cM0, G2, ER+, PR+, HER2-) - Signed by Truitt Merle, MD on 08/10/2020    07/23/2020 Initial Biopsy   DIAGNOSIS: 07/23/20 A. BREAST, RIGHT 8:00 3 CM FN; ULTRASOUND-GUIDED BIOPSY:  - INVASIVE MAMMARY CARCINOMA, NO SPECIAL TYPE.    07/23/2020 Receptors her2   ADDENDUM:  BREAST BIOMARKER TESTS  Estrogen Receptor (ER) Status: Positive (greater than 10% of cells  demonstrate nuclear positivity)                       Percentage of cells with nuclear positivity:  91-100%                       Average intensity of staining: Strong   Progesterone Receptor (PgR) Status: Positive  (greater than 10% of cells  demonstrate nuclear positivity)                       Percentage of cells with nuclear positivity: 51-90%                       Average intensity of staining: Moderate   HER2 (by immunohistochemistry): Negative (Score 0)    08/10/2020 Initial Diagnosis   Malignant neoplasm of lower-outer quadrant of right breast of female, estrogen receptor positive ([place])   08/21/2020 Surgery   RIGHT BREAST LUMPECTOMY WITH RADIOACTIVE SEED AND RIGHT AXILLARY SENTINEL LYMPH NODE BIOPSY by Dr Donne Hazel     08/21/2020 Pathology Results   FINAL MICROSCOPIC DIAGNOSIS:   A. 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  - Low grade ductal carcinoma in situ.  - Invasive carcinoma is <1 mm from the lateral margin broadly, see  comment.  - Margins are negative for in situ carcinoma.  - See oncology table.   B. LYMPH NODE, RIGHT AXILLA, SENTINEL, EXCISION:  - One of one lymph nodes negative for carcinoma (0/1).   C. BREAST, RIGHT ADDITIONAL MEDIAL MARGIN, EXCISION:  - Benign breast tissue.   D. BREAST, RIGHT ADDITIONAL SUPERIOR MARGIN, EXCISION:  - Benign breast tissue.   E. BREAST, RIGHT ADDITIONAL ANTERIOR MARGIN, EXCISION:  - Benign breast tissue.   F. BREAST, RIGHT ADDITIONAL POSTERIOR MARGIN, EXCISION:  -  Benign breast tissue.  °  °08/21/2020 Oncotype testing  ° Oncotype  °Recurrence Score 10 with distant recurrence risk at 9 years at 3% with AI or Tamoxifen alone  °Less than 1% benefit of Adjuvant chemothreapy.   °  °08/21/2020 Cancer Staging  ° Staging form: Breast, AJCC 8th Edition °- Pathologic stage from 08/21/2020: Stage IA (pT1c, pN0, cM0, G2, ER+, PR+, HER2-) - Signed by Burton, Lacie K, NP on 02/02/2021 °Nuclear grade: G2 °Histologic grading system: 3 grade system ° °  °09/28/2020 - 10/27/2020 Radiation Therapy  ° Adjuvant Radiation with Dr Squire  °  °11/2020 - 12/2020 Anti-estrogen oral therapy  ° Anastrozole 1mg daily starting 11/2020,  stopped after 1 months due to joint, bone pain, sadness, and other side effects. She is not interested in any other Antiestrogen therapy °  °02/03/2021 Survivorship  ° SCP delivered virtually by Lacie Burton, NP  °  ° ° °CURRENT THERAPY: Surveillance ° °INTERVAL HISTORY: Jean Schwartz returns for follow-up as scheduled, last seen by Dr Feng 05/05/2021.  Mammogram 07/07/2021 showed postop/post RT changes in the right, no mammographic evidence of malignancy.  She does have breast density category C.  She has a sinus infection, recently completed antibiotics with residual congestion and cough.  Denies fever or chills.  Her appetite is low from recent infection.  She is otherwise doing well.  Denies concerns in her breast such as new lump/mass, nipple discharge or inversion, or skin change.  She has chronic abdominal pain and bloating she attributes to weak pelvic floor.  She has occasional constipation which is her baseline.  Denies GI/GYN bleeding.  She takes turmeric for joint discomfort, denies new or worsening bone pain. ° °All other systems were reviewed with the patient and are negative. ° °MEDICAL HISTORY:  °Past Medical History:  °Diagnosis Date  ° Allergy   ° Arthritis   ° Breast cancer ([facility])   ° right breast, lumpectomy and rad tx  ° Cataract   ° Complication of anesthesia   ° per patient, for colonoscopy it was hard to wake up, but for previous surgery no problems waking   ° GERD (gastroesophageal reflux disease)   ° Osteoporosis   ° Personal history of radiation therapy   ° right breast ca 2021  ° ° °SURGICAL HISTORY: °Past Surgical History:  °Procedure Laterality Date  ° BREAST BIOPSY Right 07/23/2020  ° US Bx, Vision clip, I[facility]  ° BREAST LUMPECTOMY WITH RADIOACTIVE SEED AND SENTINEL LYMPH NODE BIOPSY Right 08/21/2020  ° Procedure: RIGHT BREAST LUMPECTOMY WITH RADIOACTIVE SEED AND RIGHT AXILLARY SENTINEL LYMPH NODE BIOPSY;  Surgeon: Wakefield, Matthew, MD;  Location: [facility];  Service: General;  Laterality: Right;   PEC BLOCK  ° CATARACT EXTRACTION W/ INTRAOCULAR LENS  IMPLANT, BILATERAL Bilateral   ° COLONOSCOPY    ° TUBAL LIGATION    ° ° °I have reviewed the social history and family history with the patient and they are unchanged from previous note. ° °ALLERGIES:  has No Known Allergies. ° °MEDICATIONS:  °Current Outpatient Medications  °Medication Sig Dispense Refill  ° amLODipine (NORVASC) 5 MG tablet Take 1 tablet by mouth daily.    ° APPLE CIDER VINEGAR PO Take 2 tablets by mouth daily.    ° Biotin w/ Vitamins C & E (HAIR/SKIN/NAILS PO) Take 1 tablet by mouth daily.    ° Calcium Citrate (CITRACAL PO) Take 1 tablet by mouth daily.    ° cholecalciferol (VITAMIN D) 1000 units tablet Take 1,000 Units daily by   mouth.    ° fluticasone (FLONASE) 50 MCG/ACT nasal spray Place 2 sprays into both nostrils daily as needed for allergies.     ° Multiple Vitamin (MULTIVITAMIN) tablet Take 1 tablet daily by mouth.    ° Omega-3 Fatty Acids (FISH OIL OMEGA-3) 1000 MG CAPS Take 1,000 mg by mouth daily.     ° vitamin B-12 (CYANOCOBALAMIN) 1000 MCG tablet Take 1,000 mcg by mouth daily.    ° °No current facility-administered medications for this visit.  ° ° °PHYSICAL EXAMINATION: °ECOG PERFORMANCE STATUS: 0 - Asymptomatic ° °Vitals:  ° 11/03/21 1042  °BP: 137/65  °Pulse: 70  °Resp: 19  °Temp: 98.2 °F (36.8 °C)  °SpO2: 100%  ° °Filed Weights  ° 11/03/21 1042  °Weight: 122 lb 11.2 oz (55.7 kg)  ° ° °GENERAL:alert, no distress and comfortable °SKIN: No rash °EYES: sclera clear °NECK: Without mass °LYMPH:  no palpable cervical or supraclavicular lymphadenopathy °LUNGS: clear with normal breathing effort °HEART: regular rate & rhythm and no murmurs and no lower extremity edema °ABDOMEN:abdomen soft, non-tender and normal bowel sounds °Musculoskeletal: No focal spinal tenderness °NEURO: alert & oriented x 3 with fluent speech, no focal motor/sensory deficits °Breast exam: Breast symmetric without nipple discharge or inversion.  S/p right  lumpectomy, incisions completely healed with minimal scar tissue.  No palpable mass or nodularity in either breast or axilla that I could appreciate. ° ° °LABORATORY DATA:  °I have reviewed the data as listed °CBC Latest Ref Rng & Units 11/03/2021 05/05/2021 08/10/2020  °WBC 4.0 - 10.5 K/uL 5.8 4.4 4.9  °Hemoglobin 12.0 - 15.0 g/dL 14.6 13.4 13.7  °Hematocrit 36.0 - 46.0 % 43.9 39.5 40.9  °Platelets 150 - 400 K/uL 232 157 173  ° ° ° °CMP Latest Ref Rng & Units 11/03/2021 05/05/2021 08/10/2020  °Glucose 70 - 99 mg/dL 88 93 88  °BUN 8 - 23 mg/dL 12 12 13  °Creatinine 0.44 - 1.00 mg/dL 0.88 0.75 0.78  °Sodium 135 - 145 mmol/L 140 141 142  °Potassium 3.5 - 5.1 mmol/L 5.0 4.6 4.8  °Chloride 98 - 111 mmol/L 104 109 108  °CO2 22 - 32 mmol/L 28 24 27  °Calcium 8.9 - 10.3 mg/dL 9.6 9.6 9.7  °Total Protein 6.5 - 8.1 g/dL 7.2 6.5 6.7  °Total Bilirubin 0.3 - 1.2 mg/dL 0.5 0.6 0.6  °Alkaline Phos 38 - 126 U/L 72 68 65  °AST 15 - 41 U/L 20 26 24  °ALT 0 - 44 U/L 17 19 22  ° ° ° ° °RADIOGRAPHIC STUDIES: °I have personally reviewed the radiological images as listed and agreed with the findings in the report. °No results found.  ° °ASSESSMENT & PLAN: Jean Schwartz is a 74 y.o. female with  °  °  °1. 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 S/p right breast lumpectomy and SLNB by Dr Wakefield on 08/21/20. Her Oncotype showed low risk with RS 10. She completed adjuvant radiation with Dr Squire  °-She started anastrozole in 11/2020. She stopped after 1 month due to joint, bone pain, sadness, and other side effects.  She declined tamoxifen or any other AI given concern for possible side effects  °-Mammogram 07/07/2021 was negative, showed breast density category C °-Doing well on breast cancer surveillance  °-F/u in 6 months with Dr. Feng.  °  °  ° °Disposition: °Jean Schwartz is clinically doing well.  Exam is benign, labs unremarkable,    mammogram 06/2021 is negative.  Overall there is no clinical concern for breast cancer recurrence.  Continue surveillance. ° °I encouraged her to stay up-to-date on age-appropriate health maintenance, healthy active lifestyle, DEXA per PCP, calcium/vitamin D, weightbearing exercise, and infection control. ° °She will return for lab and routine surveillance visit in 6 months, or sooner if needed.  Reviewed signs and symptoms of recurrence. ° °All questions were answered. The patient knows to call the clinic with any problems, questions or concerns. No barriers to learning were detected. ° °  ° Lacie K Burton, NP °11/03/21  ° °  °

## 2021-11-03 ENCOUNTER — Inpatient Hospital Stay: Payer: Medicare HMO | Attending: Nurse Practitioner

## 2021-11-03 ENCOUNTER — Other Ambulatory Visit: Payer: Self-pay

## 2021-11-03 ENCOUNTER — Inpatient Hospital Stay: Payer: Medicare HMO | Admitting: Nurse Practitioner

## 2021-11-03 ENCOUNTER — Encounter: Payer: Self-pay | Admitting: Nurse Practitioner

## 2021-11-03 VITALS — BP 137/65 | HR 70 | Temp 98.2°F | Resp 19 | Ht 62.0 in | Wt 122.7 lb

## 2021-11-03 DIAGNOSIS — Z170 Estrogen receptor positive status [ER+]: Secondary | ICD-10-CM | POA: Insufficient documentation

## 2021-11-03 DIAGNOSIS — C50511 Malignant neoplasm of lower-outer quadrant of right female breast: Secondary | ICD-10-CM

## 2021-11-03 LAB — CMP (CANCER CENTER ONLY)
ALT: 17 U/L (ref 0–44)
AST: 20 U/L (ref 15–41)
Albumin: 4.1 g/dL (ref 3.5–5.0)
Alkaline Phosphatase: 72 U/L (ref 38–126)
Anion gap: 8 (ref 5–15)
BUN: 12 mg/dL (ref 8–23)
CO2: 28 mmol/L (ref 22–32)
Calcium: 9.6 mg/dL (ref 8.9–10.3)
Chloride: 104 mmol/L (ref 98–111)
Creatinine: 0.88 mg/dL (ref 0.44–1.00)
GFR, Estimated: >60 mL/min (ref >60)
Glucose, Bld: 88 mg/dL (ref 70–99)
Potassium: 5 mmol/L (ref 3.5–5.1)
Sodium: 140 mmol/L (ref 135–145)
Total Bilirubin: 0.5 mg/dL (ref 0.3–1.2)
Total Protein: 7.2 g/dL (ref 6.5–8.1)

## 2021-11-03 LAB — CBC WITH DIFFERENTIAL (CANCER CENTER ONLY)
Abs Immature Granulocytes: 0.06 10*3/uL (ref 0.00–0.07)
Basophils Absolute: 0 10*3/uL (ref 0.0–0.1)
Basophils Relative: 1 %
Eosinophils Absolute: 0.3 10*3/uL (ref 0.0–0.5)
Eosinophils Relative: 5 %
HCT: 43.9 % (ref 36.0–46.0)
Hemoglobin: 14.6 g/dL (ref 12.0–15.0)
Immature Granulocytes: 1 %
Lymphocytes Relative: 16 %
Lymphs Abs: 0.9 10*3/uL (ref 0.7–4.0)
MCH: 28.4 pg (ref 26.0–34.0)
MCHC: 33.3 g/dL (ref 30.0–36.0)
MCV: 85.4 fL (ref 80.0–100.0)
Monocytes Absolute: 0.3 10*3/uL (ref 0.1–1.0)
Monocytes Relative: 5 %
Neutro Abs: 4.1 10*3/uL (ref 1.7–7.7)
Neutrophils Relative %: 72 %
Platelet Count: 232 10*3/uL (ref 150–400)
RBC: 5.14 MIL/uL — ABNORMAL HIGH (ref 3.87–5.11)
RDW: 12.4 % (ref 11.5–15.5)
WBC Count: 5.8 10*3/uL (ref 4.0–10.5)
nRBC: 0 % (ref 0.0–0.2)

## 2021-11-04 ENCOUNTER — Inpatient Hospital Stay: Payer: Medicare HMO | Admitting: Nurse Practitioner

## 2021-11-04 ENCOUNTER — Inpatient Hospital Stay: Payer: Medicare HMO

## 2021-11-24 DIAGNOSIS — Z20828 Contact with and (suspected) exposure to other viral communicable diseases: Secondary | ICD-10-CM | POA: Diagnosis not present

## 2021-11-24 DIAGNOSIS — U071 COVID-19: Secondary | ICD-10-CM | POA: Diagnosis not present

## 2021-12-30 ENCOUNTER — Encounter (HOSPITAL_COMMUNITY): Payer: Self-pay

## 2022-01-26 DIAGNOSIS — H35033 Hypertensive retinopathy, bilateral: Secondary | ICD-10-CM | POA: Diagnosis not present

## 2022-02-02 DIAGNOSIS — E7800 Pure hypercholesterolemia, unspecified: Secondary | ICD-10-CM | POA: Diagnosis not present

## 2022-02-02 DIAGNOSIS — R519 Headache, unspecified: Secondary | ICD-10-CM | POA: Diagnosis not present

## 2022-02-02 DIAGNOSIS — R7989 Other specified abnormal findings of blood chemistry: Secondary | ICD-10-CM | POA: Diagnosis not present

## 2022-02-02 DIAGNOSIS — Z79899 Other long term (current) drug therapy: Secondary | ICD-10-CM | POA: Diagnosis not present

## 2022-02-02 DIAGNOSIS — M810 Age-related osteoporosis without current pathological fracture: Secondary | ICD-10-CM | POA: Diagnosis not present

## 2022-02-02 DIAGNOSIS — Z0000 Encounter for general adult medical examination without abnormal findings: Secondary | ICD-10-CM | POA: Diagnosis not present

## 2022-02-02 DIAGNOSIS — I10 Essential (primary) hypertension: Secondary | ICD-10-CM | POA: Diagnosis not present

## 2022-02-02 DIAGNOSIS — Z853 Personal history of malignant neoplasm of breast: Secondary | ICD-10-CM | POA: Diagnosis not present

## 2022-03-28 DIAGNOSIS — M75102 Unspecified rotator cuff tear or rupture of left shoulder, not specified as traumatic: Secondary | ICD-10-CM | POA: Diagnosis not present

## 2022-03-28 DIAGNOSIS — M7582 Other shoulder lesions, left shoulder: Secondary | ICD-10-CM | POA: Diagnosis not present

## 2022-03-28 DIAGNOSIS — I10 Essential (primary) hypertension: Secondary | ICD-10-CM | POA: Diagnosis not present

## 2022-04-24 DIAGNOSIS — B9689 Other specified bacterial agents as the cause of diseases classified elsewhere: Secondary | ICD-10-CM | POA: Diagnosis not present

## 2022-04-24 DIAGNOSIS — J019 Acute sinusitis, unspecified: Secondary | ICD-10-CM | POA: Diagnosis not present

## 2022-04-27 ENCOUNTER — Encounter: Payer: Self-pay | Admitting: Hematology

## 2022-05-02 ENCOUNTER — Encounter: Payer: Self-pay | Admitting: Hematology

## 2022-05-04 ENCOUNTER — Other Ambulatory Visit: Payer: Self-pay

## 2022-05-04 ENCOUNTER — Other Ambulatory Visit: Payer: Self-pay | Admitting: Nurse Practitioner

## 2022-05-04 ENCOUNTER — Inpatient Hospital Stay: Payer: PPO | Attending: Nurse Practitioner

## 2022-05-04 ENCOUNTER — Encounter: Payer: Self-pay | Admitting: Nurse Practitioner

## 2022-05-04 ENCOUNTER — Inpatient Hospital Stay: Payer: PPO | Admitting: Nurse Practitioner

## 2022-05-04 VITALS — BP 141/65 | HR 55 | Temp 97.9°F | Resp 16 | Wt 124.2 lb

## 2022-05-04 DIAGNOSIS — Z170 Estrogen receptor positive status [ER+]: Secondary | ICD-10-CM

## 2022-05-04 DIAGNOSIS — C50511 Malignant neoplasm of lower-outer quadrant of right female breast: Secondary | ICD-10-CM

## 2022-05-04 LAB — CBC WITH DIFFERENTIAL (CANCER CENTER ONLY)
Abs Immature Granulocytes: 0.01 10*3/uL (ref 0.00–0.07)
Basophils Absolute: 0 10*3/uL (ref 0.0–0.1)
Basophils Relative: 1 %
Eosinophils Absolute: 0.2 10*3/uL (ref 0.0–0.5)
Eosinophils Relative: 4 %
HCT: 39.6 % (ref 36.0–46.0)
Hemoglobin: 13.5 g/dL (ref 12.0–15.0)
Immature Granulocytes: 0 %
Lymphocytes Relative: 23 %
Lymphs Abs: 0.9 10*3/uL (ref 0.7–4.0)
MCH: 28.9 pg (ref 26.0–34.0)
MCHC: 34.1 g/dL (ref 30.0–36.0)
MCV: 84.8 fL (ref 80.0–100.0)
Monocytes Absolute: 0.3 10*3/uL (ref 0.1–1.0)
Monocytes Relative: 8 %
Neutro Abs: 2.5 10*3/uL (ref 1.7–7.7)
Neutrophils Relative %: 64 %
Platelet Count: 171 10*3/uL (ref 150–400)
RBC: 4.67 MIL/uL (ref 3.87–5.11)
RDW: 12.8 % (ref 11.5–15.5)
WBC Count: 3.9 10*3/uL — ABNORMAL LOW (ref 4.0–10.5)
nRBC: 0 % (ref 0.0–0.2)

## 2022-05-04 LAB — CMP (CANCER CENTER ONLY)
ALT: 13 U/L (ref 0–44)
AST: 21 U/L (ref 15–41)
Albumin: 4.3 g/dL (ref 3.5–5.0)
Alkaline Phosphatase: 58 U/L (ref 38–126)
Anion gap: 5 (ref 5–15)
BUN: 17 mg/dL (ref 8–23)
CO2: 29 mmol/L (ref 22–32)
Calcium: 9.8 mg/dL (ref 8.9–10.3)
Chloride: 106 mmol/L (ref 98–111)
Creatinine: 0.86 mg/dL (ref 0.44–1.00)
GFR, Estimated: >60 mL/min (ref >60)
Glucose, Bld: 97 mg/dL (ref 70–99)
Potassium: 4.5 mmol/L (ref 3.5–5.1)
Sodium: 140 mmol/L (ref 135–145)
Total Bilirubin: 0.7 mg/dL (ref 0.3–1.2)
Total Protein: 6.8 g/dL (ref 6.5–8.1)

## 2022-05-04 NOTE — Progress Notes (Signed)
Jean Schwartz   [phone] [phone]   Clinic Follow up Note   Patient Care Team: Jean Crouch, MD as PCP - General (Internal Medicine) Jean Kaufmann, RN as Oncology Nurse Navigator Jean Germany, RN as Oncology Nurse Navigator Jean Merle, MD as Consulting Physician (Hematology) Jean Bookbinder, MD as Consulting Physician (General Surgery) Jean Gibson, MD as Attending Physician (Radiation Oncology) Jean Feeling, NP as Nurse Practitioner (Nurse Practitioner) 05/04/2022  CHIEF COMPLAINT: Follow-up right breast cancer  SUMMARY OF ONCOLOGIC HISTORY: Oncology History Overview Note  Cancer Staging Malignant neoplasm of lower-outer quadrant of right breast of female, estrogen receptor positive ([place]) Staging form: Breast, AJCC 8th Edition - Clinical stage from 07/23/2020: Stage IA (cT1b, cN0, cM0, G2, ER+, PR+, HER2-) - Signed by Jean Merle, MD on 08/10/2020    Malignant neoplasm of lower-outer quadrant of right breast of female, estrogen receptor positive ([place])  07/14/2020 Mammogram   Diagnostic Mammogram 07/14/20  IMPRESSION: 1.0 x 1.0 x 0.9cm mass in the 8 o'clock position of the right breast 3cmfn with imaging features suspicious for malignancy.   07/23/2020 Cancer Staging   Staging form: Breast, AJCC 8th Edition - Clinical stage from 07/23/2020: Stage IA (cT1b, cN0, cM0, G2, ER+, PR+, HER2-) - Signed by Jean Merle, MD on 08/10/2020   07/23/2020 Initial Biopsy   DIAGNOSIS: 07/23/20 A. BREAST, RIGHT 8:00 3 CM FN; ULTRASOUND-GUIDED BIOPSY:  - INVASIVE MAMMARY CARCINOMA, NO SPECIAL TYPE.    07/23/2020 Receptors her2   ADDENDUM:  BREAST BIOMARKER TESTS  Estrogen Receptor (ER) Status: Positive (greater than 10% of cells  demonstrate nuclear positivity)                       Percentage of cells with nuclear positivity:  91-100%                       Average intensity of staining: Strong   Progesterone Receptor (PgR) Status: Positive (greater  than 10% of cells  demonstrate nuclear positivity)                       Percentage of cells with nuclear positivity: 51-90%                       Average intensity of staining: Moderate   HER2 (by immunohistochemistry): Negative (Score 0)    08/10/2020 Initial Diagnosis   Malignant neoplasm of lower-outer quadrant of right breast of female, estrogen receptor positive ([place])   08/21/2020 Surgery   RIGHT BREAST LUMPECTOMY WITH RADIOACTIVE SEED AND RIGHT AXILLARY SENTINEL LYMPH NODE BIOPSY by Dr Donne Hazel     08/21/2020 Pathology Results   FINAL MICROSCOPIC DIAGNOSIS:   A. 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  - Low grade ductal carcinoma in situ.  - Invasive carcinoma is <1 mm from the lateral margin broadly, see  comment.  - Margins are negative for in situ carcinoma.  - See oncology table.   B. LYMPH NODE, RIGHT AXILLA, SENTINEL, EXCISION:  - One of one lymph nodes negative for carcinoma (0/1).   C. BREAST, RIGHT ADDITIONAL MEDIAL MARGIN, EXCISION:  - Benign breast tissue.   D. BREAST, RIGHT ADDITIONAL SUPERIOR MARGIN, EXCISION:  - Benign breast tissue.   E. BREAST, RIGHT ADDITIONAL ANTERIOR MARGIN, EXCISION:  - Benign breast tissue.   F. BREAST, RIGHT ADDITIONAL POSTERIOR MARGIN, EXCISION:  - Benign  breast tissue.    08/21/2020 Oncotype testing   Oncotype  Recurrence Score 10 with distant recurrence risk at 9 years at 3% with AI or Tamoxifen alone  Less than 1% benefit of Adjuvant chemothreapy.     08/21/2020 Cancer Staging   Staging form: Breast, AJCC 8th Edition - Pathologic stage from 08/21/2020: Stage IA (pT1c, pN0, cM0, G2, ER+, PR+, HER2-) - Signed by Jean Feeling, NP on 02/02/2021 Nuclear grade: G2 Histologic grading system: 3 grade system   09/28/2020 - 10/27/2020 Radiation Therapy   Adjuvant Radiation with Dr Isidore Moos    11/2020 - 12/2020 Anti-estrogen oral therapy   Anastrozole 33m daily starting 11/2020, stopped  after 1 months due to joint, bone pain, sadness, and other side effects. She is not interested in any other Antiestrogen therapy   02/03/2021 Survivorship   SCP delivered virtually by LCira Rue NP      CURRENT THERAPY: Surveillance  INTERVAL HISTORY: [name] for follow-up as scheduled, last seen by me 11/03/2021.  She is doing well overall.  Has seasonal allergies.  She notes last time she was at the beach her right breast still got red underneath her clothing and occasionally hurts like a sunburn.  Denies new lump/mass, nipple discharge or inversion, or skin change.  Due for bilateral mammogram 06/2022.  Appetite and energy are at baseline, she takes calcium and vitamin D.  She keeps her great grandchildren at times.  Her PCP is managing thyroid level.  All other systems were reviewed with the patient and are negative.  MEDICAL HISTORY:  Past Medical History:  Diagnosis Date   Allergy    Arthritis    [place]ast [place]    right breast, lumpectomy and rad tx   Cataract    Complication of anesthesia    per patient, for colonoscopy it was hard to wake up, but for previous surgery no problems waking    GERD (gastroesophageal reflux disease)    Osteoporosis    Personal history of radiation therapy    right breast ca 2021    SURGICAL HISTORY: Past Surgical History:  Procedure Laterality Date   BREAST BIOPSY Right 07/23/2020   [place]  BREAST LUMPECTOMY WITH RADIOACTIVE SEED AND SENTINEL LYMPH NODE BIOPSY Right 08/21/2020   Procedure: RIGHT BREAST LUMPECTOMY WITH RADIOACTIVE SEED AND RIGHT AXILLARY SENTINEL LYMPH NODE BIOPSY;  Surgeon: WRolm Bookbinder MD;  [facility]  Service: General;  Laterality: Right;  PEC BLOCK   CATARACT EXTRACTION W/ INTRAOCULAR LENS  IMPLANT, BILATERAL Bilateral    COLONOSCOPY     TUBAL LIGATION      I have reviewed the social history and family history with the patient and they are unchanged from previous  note.  ALLERGIES:  has No Known Allergies.  MEDICATIONS:  Current Outpatient Medications  Medication Sig Dispense Refill   amLODipine (NORVASC) 5 MG tablet Take 1 tablet by mouth daily.     APPLE CIDER VINEGAR PO Take 2 tablets by mouth daily.     Biotin w/ Vitamins C & E (HAIR/SKIN/NAILS PO) Take 1 tablet by mouth daily.     Calcium Citrate (CITRACAL PO) Take 1 tablet by mouth daily.     cholecalciferol (VITAMIN D) 1000 units tablet Take 1,000 Units daily by mouth.     fluticasone (FLONASE) 50 MCG/ACT nasal spray Place 2 sprays into both nostrils daily as needed for allergies.      Multiple Vitamin (MULTIVITAMIN) tablet Take 1 tablet daily by  mouth.     Omega-3 Fatty Acids (FISH OIL OMEGA-3) 1000 MG CAPS Take 1,000 mg by mouth daily.      vitamin B-12 (CYANOCOBALAMIN) 1000 MCG tablet Take 1,000 mcg by mouth daily.     No current facility-administered medications for this visit.    PHYSICAL EXAMINATION: ECOG PERFORMANCE STATUS: 0 - Asymptomatic  Vitals:   05/04/22 1119  BP: (!) 141/65  Pulse: (!) 55  Resp: 16  Temp: 97.9 F (36.6 C)  SpO2: 100%   Filed Weights   05/04/22 1119  Weight: 124 lb 3.2 oz (56.3 kg)    GENERAL:alert, no distress and comfortable SKIN: No rash EYES: sclera clear NECK: Without mass LYMPH:  no palpable cervical or supraclavicular lymphadenopathy LUNGS: normal breathing effort HEART:  no lower extremity edema ABDOMEN:abdomen soft, non-tender and normal bowel sounds NEURO: alert & oriented x 3 with fluent speech, no focal motor/sensory deficits Breast exam: Breasts are symmetrical without nipple discharge or inversion.  S/p right lumpectomy, incisions completely healed.  No palpable mass or nodularity in either breast or axilla that I could appreciate.  LABORATORY DATA:  I have reviewed the data as listed    Latest Ref Rng & Units 05/04/2022   10:02 AM 11/03/2021    9:59 AM 05/05/2021    9:34 AM  CBC  WBC 4.0 - 10.5 K/uL 3.9  5.8  4.4    Hemoglobin 12.0 - 15.0 g/dL 13.5  14.6  13.4   Hematocrit 36.0 - 46.0 % 39.6  43.9  39.5   Platelets 150 - 400 K/uL 171  232  157         Latest Ref Rng & Units 05/04/2022   10:02 AM 11/03/2021    9:59 AM 05/05/2021    9:34 AM  CMP  Glucose 70 - 99 mg/dL 97  88  93   BUN 8 - 23 mg/dL 17  12  12    Creatinine 0.44 - 1.00 mg/dL 0.86  0.88  0.75   Sodium 135 - 145 mmol/L 140  140  141   Potassium 3.5 - 5.1 mmol/L 4.5  5.0  4.6   Chloride 98 - 111 mmol/L 106  104  109   CO2 22 - 32 mmol/L 29  28  24    Calcium 8.9 - 10.3 mg/dL 9.8  9.6  9.6   Total Protein 6.5 - 8.1 g/dL 6.8  7.2  6.5   Total Bilirubin 0.3 - 1.2 mg/dL 0.7  0.5  0.6   Alkaline Phos 38 - 126 U/L 58  72  68   AST 15 - 41 U/L 21  20  26    ALT 0 - 44 U/L 13  17  19        RADIOGRAPHIC STUDIES: I have personally reviewed the radiological images as listed and agreed with the findings in the report. No results found.   ASSESSMENT & PLAN: Jean Schwartz is a 76 y.o. female with      1. 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 S/p right breast lumpectomy and SLNB by Dr Donne Hazel on 08/21/20. Her Oncotype showed low risk with RS 10. She completed adjuvant radiation with Dr Isidore Moos  -She started anastrozole in 11/2020. She stopped after 1 month due to joint, bone pain, sadness, and other side effects.  She declined tamoxifen or any other AI given concern for possible side effects  -Ms. Chuba is clinically doing well.  Exam is benign, labs are stable.  Overall there is no clinical concern for breast cancer recurrence.   -next mammogram due 06/2022  -She is nearing 2 years from initial diagnosis, the recurrence risk has decreased somewhat, continue surveillance  -Follow-up in 6 months, or sooner if needed   2.  Abnormal TSH, health maintenance -TSH 5 -8 in the past year, monitored by PCP  -I encouraged her to continue healthy  active lifestyle and stay up-to-date on age-appropriate health maintenance and cancer screenings   Plan: -Continue breast cancer surveillance -Mammogram in 06/2022 -Routine visit in 6 months, or sooner if needed -Continue ongoing PCP follow-up     Orders Placed This Encounter  Procedures   MM DIAG BREAST TOMO BILATERAL    Standing Status:   Future    Standing Expiration Date:   05/05/2023    Order Specific Question:   Reason for Exam (SYMPTOM  OR DIAGNOSIS REQUIRED)    Answer:   h/o R breast cancer 2021 s/p lumpectomy and RT    Order Specific Question:   Preferred imaging location?    Answer:   [facility]   All questions were answered. The patient knows to call the clinic with any problems, questions or concerns. No barriers to learning were detected.      Jean Feeling, NP 05/04/22

## 2022-05-06 DIAGNOSIS — R946 Abnormal results of thyroid function studies: Secondary | ICD-10-CM | POA: Diagnosis not present

## 2022-05-16 DIAGNOSIS — D2262 Melanocytic nevi of left upper limb, including shoulder: Secondary | ICD-10-CM | POA: Diagnosis not present

## 2022-05-16 DIAGNOSIS — L570 Actinic keratosis: Secondary | ICD-10-CM | POA: Diagnosis not present

## 2022-05-16 DIAGNOSIS — X32XXXA Exposure to sunlight, initial encounter: Secondary | ICD-10-CM | POA: Diagnosis not present

## 2022-05-16 DIAGNOSIS — L821 Other seborrheic keratosis: Secondary | ICD-10-CM | POA: Diagnosis not present

## 2022-05-16 DIAGNOSIS — D2271 Melanocytic nevi of right lower limb, including hip: Secondary | ICD-10-CM | POA: Diagnosis not present

## 2022-05-16 DIAGNOSIS — D2261 Melanocytic nevi of right upper limb, including shoulder: Secondary | ICD-10-CM | POA: Diagnosis not present

## 2022-05-16 DIAGNOSIS — D225 Melanocytic nevi of trunk: Secondary | ICD-10-CM | POA: Diagnosis not present

## 2022-05-16 DIAGNOSIS — D2272 Melanocytic nevi of left lower limb, including hip: Secondary | ICD-10-CM | POA: Diagnosis not present

## 2022-07-11 ENCOUNTER — Ambulatory Visit
Admission: RE | Admit: 2022-07-11 | Discharge: 2022-07-11 | Disposition: A | Discharge status: Home / Self Care | Payer: PPO | Source: Ambulatory Visit | Attending: Nurse Practitioner | Admitting: Nurse Practitioner

## 2022-07-11 DIAGNOSIS — Z170 Estrogen receptor positive status [ER+]: Secondary | ICD-10-CM

## 2022-07-11 DIAGNOSIS — R928 Other abnormal and inconclusive findings on diagnostic imaging of breast: Secondary | ICD-10-CM | POA: Diagnosis not present

## 2022-07-11 DIAGNOSIS — Z853 Personal history of malignant neoplasm of breast: Secondary | ICD-10-CM | POA: Diagnosis not present

## 2022-08-03 DIAGNOSIS — Z79899 Other long term (current) drug therapy: Secondary | ICD-10-CM | POA: Diagnosis not present

## 2022-08-03 DIAGNOSIS — E7800 Pure hypercholesterolemia, unspecified: Secondary | ICD-10-CM | POA: Diagnosis not present

## 2022-08-03 DIAGNOSIS — M810 Age-related osteoporosis without current pathological fracture: Secondary | ICD-10-CM | POA: Diagnosis not present

## 2022-08-03 DIAGNOSIS — Z853 Personal history of malignant neoplasm of breast: Secondary | ICD-10-CM | POA: Diagnosis not present

## 2022-08-03 DIAGNOSIS — I10 Essential (primary) hypertension: Secondary | ICD-10-CM | POA: Diagnosis not present

## 2022-10-30 NOTE — Progress Notes (Unsigned)
[place]   [phone] Fax:([phone]) [phone]   Clinic Follow up Note   Patient Care Team: Jean Crouch, MD as PCP - General (Internal Medicine) Jean Kaufmann, RN as Oncology Nurse Navigator Jean Germany, RN as Oncology Nurse Navigator Jean Merle, MD as Consulting Physician (Hematology) Jean Bookbinder, MD as Consulting Physician (General Surgery) Jean Gibson, MD as Attending Physician (Radiation Oncology) Jean Feeling, NP as Nurse Practitioner (Nurse Practitioner) 11/01/2022  CHIEF COMPLAINT: Follow up right breast cancer   SUMMARY OF ONCOLOGIC HISTORY: Oncology History Overview Note  Cancer Staging Malignant neoplasm of lower-outer quadrant of right breast of female, estrogen receptor positive ([place]) Staging form: Breast, AJCC 8th Edition - Clinical stage from 07/23/2020: Stage IA (cT1b, cN0, cM0, G2, ER+, PR+, HER2-) - Signed by Jean Merle, MD on 08/10/2020    Malignant neoplasm of lower-outer quadrant of right breast of female, estrogen receptor positive ([place])  07/14/2020 Mammogram   Diagnostic Mammogram 07/14/20  IMPRESSION: 1.0 x 1.0 x 0.9cm mass in the 8 o'clock position of the right breast 3cmfn with imaging features suspicious for malignancy.   07/23/2020 Cancer Staging   Staging form: Breast, AJCC 8th Edition - Clinical stage from 07/23/2020: Stage IA (cT1b, cN0, cM0, G2, ER+, PR+, HER2-) - Signed by Jean Merle, MD on 08/10/2020   07/23/2020 Initial Biopsy   DIAGNOSIS: 07/23/20 A. BREAST, RIGHT 8:00 3 CM FN; ULTRASOUND-GUIDED BIOPSY:  - INVASIVE MAMMARY CARCINOMA, NO SPECIAL TYPE.    07/23/2020 Receptors her2   ADDENDUM:  BREAST BIOMARKER TESTS  Estrogen Receptor (ER) Status: Positive (greater than 10% of cells  demonstrate nuclear positivity)                       Percentage of cells with nuclear positivity:  91-100%                       Average intensity of staining: Strong   Progesterone Receptor (PgR) Status: Positive (greater  than 10% of cells  demonstrate nuclear positivity)                       Percentage of cells with nuclear positivity: 51-90%                       Average intensity of staining: Moderate   HER2 (by immunohistochemistry): Negative (Score 0)    08/10/2020 Initial Diagnosis   Malignant neoplasm of lower-outer quadrant of right breast of female, estrogen receptor positive ([place])   08/21/2020 Surgery   RIGHT BREAST LUMPECTOMY WITH RADIOACTIVE SEED AND RIGHT AXILLARY SENTINEL LYMPH NODE BIOPSY by Dr Jean Schwartz     08/21/2020 Pathology Results   FINAL MICROSCOPIC DIAGNOSIS:   A. 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  - Low grade ductal carcinoma in situ.  - Invasive carcinoma is <1 mm from the lateral margin broadly, see  comment.  - Margins are negative for in situ carcinoma.  - See oncology table.   B. LYMPH NODE, RIGHT AXILLA, SENTINEL, EXCISION:  - One of one lymph nodes negative for carcinoma (0/1).   C. BREAST, RIGHT ADDITIONAL MEDIAL MARGIN, EXCISION:  - Benign breast tissue.   D. BREAST, RIGHT ADDITIONAL SUPERIOR MARGIN, EXCISION:  - Benign breast tissue.   E. BREAST, RIGHT ADDITIONAL ANTERIOR MARGIN, EXCISION:  - Benign breast tissue.   F. BREAST, RIGHT ADDITIONAL POSTERIOR MARGIN, EXCISION:  -  Benign breast tissue.    08/21/2020 Oncotype testing   Oncotype  Recurrence Score 10 with distant recurrence risk at 9 years at 3% with AI or Tamoxifen alone  Less than 1% benefit of Adjuvant chemothreapy.     08/21/2020 Cancer Staging   Staging form: Breast, AJCC 8th Edition - Pathologic stage from 08/21/2020: Stage IA (pT1c, pN0, cM0, G2, ER+, PR+, HER2-) - Signed by Jean Feeling, NP on 02/02/2021 Nuclear grade: G2 Histologic grading system: 3 grade system   09/28/2020 - 10/27/2020 Radiation Therapy   Adjuvant Radiation with Dr Jean Schwartz    11/2020 - 12/2020 Anti-estrogen oral therapy   Anastrozole 85m daily starting 11/2020, stopped  after 1 months due to joint, bone pain, sadness, and other side effects. She is not interested in any other Antiestrogen therapy   02/03/2021 Survivorship   SCP delivered virtually by LCira Rue NP      CURRENT THERAPY: Surveillance   INTERVAL HISTORY: Ms. MDlouhyreturns for follow up as scheduled. Last seen by me 05/04/22. Mammogram 07/11/22 was negative.  Denies concerns in her breast such as new lump/mass, nipple discharge or inversion, or skin change.  She is working with her dentist on some jaw pain.  Denies any other body/joint pain, unintentional weight loss, fatigue, or any other new specific complaints.  All other systems were reviewed with the patient and are negative.  MEDICAL HISTORY:  Past Medical History:  Diagnosis Date   Allergy    Arthritis    [place]ast [place] ([place]    right breast, lumpectomy and rad tx   Cataract    Complication of anesthesia    per patient, for colonoscopy it was hard to wake up, but for previous surgery no problems waking    GERD (gastroesophageal reflux disease)    Osteoporosis    Personal history of radiation therapy    right breast ca 2021    SURGICAL HISTORY: Past Surgical History:  Procedure Laterality Date   BREAST BIOPSY Right 07/23/2020   [place]  BREAST LUMPECTOMY WITH RADIOACTIVE SEED AND SENTINEL LYMPH NODE BIOPSY Right 08/21/2020   Procedure: RIGHT BREAST LUMPECTOMY WITH RADIOACTIVE SEED AND RIGHT AXILLARY SENTINEL LYMPH NODE BIOPSY;  Surgeon: WRolm Bookbinder MD;  [place]  Service: General;  Laterality: Right;  PEC BLOCK   CATARACT EXTRACTION W/ INTRAOCULAR LENS  IMPLANT, BILATERAL Bilateral    COLONOSCOPY     TUBAL LIGATION      I have reviewed the social history and family history with the patient and they are unchanged from previous note.  ALLERGIES:  has No Known Allergies.  MEDICATIONS:  Current Outpatient Medications  Medication Sig Dispense Refill   APPLE CIDER VINEGAR PO Take 2 tablets  by mouth daily.     Biotin w/ Vitamins C & E (HAIR/SKIN/NAILS PO) Take 1 tablet by mouth daily.     Calcium Citrate (CITRACAL PO) Take 1 tablet by mouth daily.     cholecalciferol (VITAMIN D) 1000 units tablet Take 1,000 Units daily by mouth.     fluticasone (FLONASE) 50 MCG/ACT nasal spray Place 2 sprays into both nostrils daily as needed for allergies.      Multiple Vitamin (MULTIVITAMIN) tablet Take 1 tablet daily by mouth.     Omega-3 Fatty Acids (FISH OIL OMEGA-3) 1000 MG CAPS Take 1,000 mg by mouth daily.      vitamin B-12 (CYANOCOBALAMIN) 1000 MCG tablet Take 1,000 mcg by mouth daily.     amLODipine (NORVASC) 5  MG tablet Take 1 tablet by mouth daily.     No current facility-administered medications for this visit.    PHYSICAL EXAMINATION: ECOG PERFORMANCE STATUS: 0 - Asymptomatic  Vitals:   11/01/22 1134 11/01/22 1152  BP: (!) 176/64 (!) 140/70  Pulse: 60   Resp: 15   Temp: 98.2 F (36.8 C)   SpO2: 100%    Filed Weights   11/01/22 1134  Weight: 125 lb 9.6 oz (57 kg)    GENERAL:alert, no distress and comfortable SKIN: No rash  EYES: sclera clear NECK: without mass LYMPH:  no palpable cervical or supraclavicular lymphadenopathy LUNGS:  normal breathing effort HEART: no lower extremity edema NEURO: alert & oriented x 3 with fluent speech, no focal motor/sensory deficits Breast exam: Symmetrical without nipple discharge or inversion.  S/p right lumpectomy, incisions completely healed.  No palpable mass or nodularity in either breast or axilla that I could appreciate.   LABORATORY DATA:  I have reviewed the data as listed    Latest Ref Rng & Units 11/01/2022   10:25 AM 05/04/2022   10:02 AM 11/03/2021    9:59 AM  CBC  WBC 4.0 - 10.5 K/uL 3.8  3.9  5.8   Hemoglobin 12.0 - 15.0 g/dL 13.8  13.5  14.6   Hematocrit 36.0 - 46.0 % 40.0  39.6  43.9   Platelets 150 - 400 K/uL 158  171  232         Latest Ref Rng & Units 11/01/2022   10:25 AM 05/04/2022   10:02 AM  11/03/2021    9:59 AM  CMP  Glucose 70 - 99 mg/dL 96  97  88   BUN 8 - 23 mg/dL _0 Creatinine 0.44 - 1.00 mg/dL 0.74  0.86  0.88   Sodium 135 - 145 mmol/L 142  140  140   Potassium 3.5 - 5.1 mmol/L 4.5  4.5  5.0   Chloride 98 - 111 mmol/L 108  106  104   CO2 22 - 32 mmol/L _1 Calcium 8.9 - 10.3 mg/dL 9.9  9.8  9.6   Total Protein 6.5 - 8.1 g/dL 6.3  6.8  7.2   Total Bilirubin 0.3 - 1.2 mg/dL 0.7  0.7  0.5   Alkaline Phos 38 - 126 U/L 51  58  72   AST 15 - 41 U/L _2 ALT 0 - 44 U/L _3 RADIOGRAPHIC STUDIES: I have personally reviewed the radiological images as listed and agreed with the findings in the report. No results found.   ASSESSMENT & PLAN: Jean Schwartz is a 76 y.o. female with      1. 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 S/p right breast lumpectomy and SLNB by Dr Jean Schwartz on 08/21/20. Her Oncotype showed low risk with RS 10. She completed adjuvant radiation with Dr Jean Schwartz  -She started anastrozole in 11/2020. She stopped after 1 month due to joint, bone pain, sadness, and other side effects.  She declined tamoxifen or any other AI given concern for possible side effects  -Jean Schwartz is clinically doing well.  Exam is benign, labs are stable.  Mammogram 06/2022 was benign.  Overall there is no clinical concern for breast cancer recurrence. -Continue breast cancer surveillance. -Follow-up in 6 then 12  months   2.  Abnormal TSH, health maintenance -TSH 5 -8 in the past -Per PCP  3. Bone health -She may have had osteoporosis in the past, Zometa was discussed but she never received it by our records -Jaw pain f/up per dentist  -Repeat DEXAs per PCP   PLAN: -Labs reviewed -Continue breast cancer surveillance -F/up in 6 months, then 1 year -Mammogram due 06/2023   All questions were answered. The patient knows to call  the clinic with any problems, questions or concerns. No barriers to learning were detected.      Jean Feeling, NP 11/01/22

## 2022-11-01 ENCOUNTER — Other Ambulatory Visit: Payer: Self-pay

## 2022-11-01 ENCOUNTER — Inpatient Hospital Stay: Payer: PPO | Attending: Nurse Practitioner

## 2022-11-01 ENCOUNTER — Inpatient Hospital Stay: Payer: PPO | Admitting: Nurse Practitioner

## 2022-11-01 ENCOUNTER — Encounter: Payer: Self-pay | Admitting: Nurse Practitioner

## 2022-11-01 VITALS — BP 140/70 | HR 60 | Temp 98.2°F | Resp 15 | Wt 125.6 lb

## 2022-11-01 DIAGNOSIS — Z170 Estrogen receptor positive status [ER+]: Secondary | ICD-10-CM | POA: Diagnosis not present

## 2022-11-01 DIAGNOSIS — Z923 Personal history of irradiation: Secondary | ICD-10-CM | POA: Insufficient documentation

## 2022-11-01 DIAGNOSIS — C50511 Malignant neoplasm of lower-outer quadrant of right female breast: Secondary | ICD-10-CM | POA: Diagnosis not present

## 2022-11-01 LAB — CMP (CANCER CENTER ONLY)
ALT: 15 U/L (ref 0–44)
AST: 23 U/L (ref 15–41)
Albumin: 4.3 g/dL (ref 3.5–5.0)
Alkaline Phosphatase: 51 U/L (ref 38–126)
Anion gap: 5 (ref 5–15)
BUN: 16 mg/dL (ref 8–23)
CO2: 29 mmol/L (ref 22–32)
Calcium: 9.9 mg/dL (ref 8.9–10.3)
Chloride: 108 mmol/L (ref 98–111)
Creatinine: 0.74 mg/dL (ref 0.44–1.00)
GFR, Estimated: >60 mL/min (ref >60)
Glucose, Bld: 96 mg/dL (ref 70–99)
Potassium: 4.5 mmol/L (ref 3.5–5.1)
Sodium: 142 mmol/L (ref 135–145)
Total Bilirubin: 0.7 mg/dL (ref 0.3–1.2)
Total Protein: 6.3 g/dL — ABNORMAL LOW (ref 6.5–8.1)

## 2022-11-01 LAB — CBC WITH DIFFERENTIAL (CANCER CENTER ONLY)
Abs Immature Granulocytes: 0.01 10*3/uL (ref 0.00–0.07)
Basophils Absolute: 0 10*3/uL (ref 0.0–0.1)
Basophils Relative: 1 %
Eosinophils Absolute: 0.2 10*3/uL (ref 0.0–0.5)
Eosinophils Relative: 6 %
HCT: 40 % (ref 36.0–46.0)
Hemoglobin: 13.8 g/dL (ref 12.0–15.0)
Immature Granulocytes: 0 %
Lymphocytes Relative: 23 %
Lymphs Abs: 0.9 10*3/uL (ref 0.7–4.0)
MCH: 30.1 pg (ref 26.0–34.0)
MCHC: 34.5 g/dL (ref 30.0–36.0)
MCV: 87.3 fL (ref 80.0–100.0)
Monocytes Absolute: 0.3 10*3/uL (ref 0.1–1.0)
Monocytes Relative: 7 %
Neutro Abs: 2.4 10*3/uL (ref 1.7–7.7)
Neutrophils Relative %: 63 %
Platelet Count: 158 10*3/uL (ref 150–400)
RBC: 4.58 MIL/uL (ref 3.87–5.11)
RDW: 12.8 % (ref 11.5–15.5)
WBC Count: 3.8 10*3/uL — ABNORMAL LOW (ref 4.0–10.5)
nRBC: 0 % (ref 0.0–0.2)

## 2022-11-02 ENCOUNTER — Ambulatory Visit: Payer: PPO | Admitting: Nurse Practitioner

## 2022-11-02 ENCOUNTER — Other Ambulatory Visit: Payer: PPO

## 2022-11-08 DIAGNOSIS — R899 Unspecified abnormal finding in specimens from other organs, systems and tissues: Secondary | ICD-10-CM | POA: Diagnosis not present

## 2022-11-15 DIAGNOSIS — R6884 Jaw pain: Secondary | ICD-10-CM | POA: Diagnosis not present

## 2022-11-15 DIAGNOSIS — E7800 Pure hypercholesterolemia, unspecified: Secondary | ICD-10-CM | POA: Diagnosis not present

## 2022-11-15 DIAGNOSIS — I10 Essential (primary) hypertension: Secondary | ICD-10-CM | POA: Diagnosis not present

## 2023-01-04 DIAGNOSIS — B9789 Other viral agents as the cause of diseases classified elsewhere: Secondary | ICD-10-CM | POA: Diagnosis not present

## 2023-01-04 DIAGNOSIS — Z03818 Encounter for observation for suspected exposure to other biological agents ruled out: Secondary | ICD-10-CM | POA: Diagnosis not present

## 2023-01-04 DIAGNOSIS — J028 Acute pharyngitis due to other specified organisms: Secondary | ICD-10-CM | POA: Diagnosis not present

## 2023-02-08 DIAGNOSIS — I10 Essential (primary) hypertension: Secondary | ICD-10-CM | POA: Diagnosis not present

## 2023-02-08 DIAGNOSIS — Z1231 Encounter for screening mammogram for malignant neoplasm of breast: Secondary | ICD-10-CM | POA: Diagnosis not present

## 2023-02-08 DIAGNOSIS — E785 Hyperlipidemia, unspecified: Secondary | ICD-10-CM | POA: Diagnosis not present

## 2023-02-08 DIAGNOSIS — M810 Age-related osteoporosis without current pathological fracture: Secondary | ICD-10-CM | POA: Diagnosis not present

## 2023-02-08 DIAGNOSIS — Z79899 Other long term (current) drug therapy: Secondary | ICD-10-CM | POA: Diagnosis not present

## 2023-02-08 DIAGNOSIS — Z0000 Encounter for general adult medical examination without abnormal findings: Secondary | ICD-10-CM | POA: Diagnosis not present

## 2023-02-09 ENCOUNTER — Other Ambulatory Visit: Payer: Self-pay | Admitting: Internal Medicine

## 2023-02-09 DIAGNOSIS — Z1231 Encounter for screening mammogram for malignant neoplasm of breast: Secondary | ICD-10-CM

## 2023-02-22 DIAGNOSIS — R399 Unspecified symptoms and signs involving the genitourinary system: Secondary | ICD-10-CM | POA: Diagnosis not present

## 2023-03-07 DIAGNOSIS — R399 Unspecified symptoms and signs involving the genitourinary system: Secondary | ICD-10-CM | POA: Diagnosis not present

## 2023-03-23 DIAGNOSIS — H524 Presbyopia: Secondary | ICD-10-CM | POA: Diagnosis not present

## 2023-03-29 ENCOUNTER — Other Ambulatory Visit: Payer: Self-pay | Admitting: Hematology and Oncology

## 2023-04-11 DIAGNOSIS — M810 Age-related osteoporosis without current pathological fracture: Secondary | ICD-10-CM | POA: Diagnosis not present

## 2023-04-30 NOTE — Progress Notes (Unsigned)
Patient Care Team: Marguarite Arbour, MD as PCP - General (Internal Medicine) Pershing Proud, RN as Oncology Nurse Navigator Donnelly Angelica, RN as Oncology Nurse Navigator Malachy Mood, MD as Consulting Physician (Hematology) Emelia Loron, MD as Consulting Physician (General Surgery) Lonie Peak, MD as Attending Physician (Radiation Oncology) Pollyann Samples, NP as Nurse Practitioner (Nurse Practitioner)   CHIEF COMPLAINT: Follow up right breast cancer   Oncology History Overview Note  Cancer Staging Malignant neoplasm of lower-outer quadrant of right breast of female, estrogen receptor positive (HCC) Staging form: Breast, AJCC 8th Edition - Clinical stage from 07/23/2020: Stage IA (cT1b, cN0, cM0, G2, ER+, PR+, HER2-) - Signed by Malachy Mood, MD on 08/10/2020    Malignant neoplasm of lower-outer quadrant of right breast of female, estrogen receptor positive (HCC)  07/14/2020 Mammogram   Diagnostic Mammogram 07/14/20  IMPRESSION: 1.0 x 1.0 x 0.9cm mass in the 8 o'clock position of the right breast 3cmfn with imaging features suspicious for malignancy.   07/23/2020 Cancer Staging   Staging form: Breast, AJCC 8th Edition - Clinical stage from 07/23/2020: Stage IA (cT1b, cN0, cM0, G2, ER+, PR+, HER2-) - Signed by Malachy Mood, MD on 08/10/2020   07/23/2020 Initial Biopsy   DIAGNOSIS: 07/23/20 A. BREAST, RIGHT 8:00 3 CM FN; ULTRASOUND-GUIDED BIOPSY:  - INVASIVE MAMMARY CARCINOMA, NO SPECIAL TYPE.    07/23/2020 Receptors her2   ADDENDUM:  BREAST BIOMARKER TESTS  Estrogen Receptor (ER) Status: Positive (greater than 10% of cells  demonstrate nuclear positivity)                       Percentage of cells with nuclear positivity:  91-100%                       Average intensity of staining: Strong   Progesterone Receptor (PgR) Status: Positive (greater than 10% of cells  demonstrate nuclear positivity)                       Percentage of cells with nuclear positivity: 51-90%                        Average intensity of staining: Moderate   HER2 (by immunohistochemistry): Negative (Score 0)    08/10/2020 Initial Diagnosis   Malignant neoplasm of lower-outer quadrant of right breast of female, estrogen receptor positive (HCC)   08/21/2020 Surgery   RIGHT BREAST LUMPECTOMY WITH RADIOACTIVE SEED AND RIGHT AXILLARY SENTINEL LYMPH NODE BIOPSY by Dr Dwain Sarna     08/21/2020 Pathology Results   FINAL MICROSCOPIC DIAGNOSIS:   A. 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  - Low grade ductal carcinoma in situ.  - Invasive carcinoma is <1 mm from the lateral margin broadly, see  comment.  - Margins are negative for in situ carcinoma.  - See oncology table.   B. LYMPH NODE, RIGHT AXILLA, SENTINEL, EXCISION:  - One of one lymph nodes negative for carcinoma (0/1).   C. BREAST, RIGHT ADDITIONAL MEDIAL MARGIN, EXCISION:  - Benign breast tissue.   D. BREAST, RIGHT ADDITIONAL SUPERIOR MARGIN, EXCISION:  - Benign breast tissue.   E. BREAST, RIGHT ADDITIONAL ANTERIOR MARGIN, EXCISION:  - Benign breast tissue.   F. BREAST, RIGHT ADDITIONAL POSTERIOR MARGIN, EXCISION:  - Benign breast tissue.    08/21/2020 Oncotype testing   Oncotype  Recurrence Score 10 with distant recurrence  risk at 9 years at 3% with AI or Tamoxifen alone  Less than 1% benefit of Adjuvant chemothreapy.     08/21/2020 Cancer Staging   Staging form: Breast, AJCC 8th Edition - Pathologic stage from 08/21/2020: Stage IA (pT1c, pN0, cM0, G2, ER+, PR+, HER2-) - Signed by Pollyann Samples, NP on 02/02/2021 Nuclear grade: G2 Histologic grading system: 3 grade system   09/28/2020 - 10/27/2020 Radiation Therapy   Adjuvant Radiation with Dr Basilio Cairo    11/2020 - 12/2020 Anti-estrogen oral therapy   Anastrozole 1mg  daily starting 11/2020, stopped after 1 months due to joint, bone pain, sadness, and other side effects. She is not interested in any other Antiestrogen therapy   02/03/2021  Survivorship   SCP delivered virtually by Santiago Glad, NP       CURRENT THERAPY: Surveillance   INTERVAL HISTORY Ms. Dieterich returns for follow up as scheduled. Last seen by me 11/01/22.   Mammogram scheduled in August.   ROS   Past Medical History:  Diagnosis Date   Allergy    Arthritis    Breast cancer (HCC)    right breast, lumpectomy and rad tx   Cataract    Complication of anesthesia    per patient, for colonoscopy it was hard to wake up, but for previous surgery no problems waking    GERD (gastroesophageal reflux disease)    Osteoporosis    Personal history of radiation therapy    right breast ca 2021     Past Surgical History:  Procedure Laterality Date   BREAST BIOPSY Right 07/23/2020   [place] Bx, Vision clip, IMC   BREAST LUMPECTOMY WITH RADIOACTIVE SEED AND SENTINEL LYMPH NODE BIOPSY Right 08/21/2020   Procedure: RIGHT BREAST LUMPECTOMY WITH RADIOACTIVE SEED AND RIGHT AXILLARY SENTINEL LYMPH NODE BIOPSY;  Surgeon: Emelia Loron, MD;  Location: [facility] OR;  Service: General;  Laterality: Right;  PEC BLOCK   CATARACT EXTRACTION W/ INTRAOCULAR LENS  IMPLANT, BILATERAL Bilateral    COLONOSCOPY     TUBAL LIGATION       Outpatient Encounter Medications as of 05/02/2023  Medication Sig   amLODipine (NORVASC) 5 MG tablet Take 1 tablet by mouth daily.   APPLE CIDER VINEGAR PO Take 2 tablets by mouth daily.   Biotin w/ Vitamins C & E (HAIR/SKIN/NAILS PO) Take 1 tablet by mouth daily.   Calcium Citrate (CITRACAL PO) Take 1 tablet by mouth daily.   cholecalciferol (VITAMIN D) 1000 units tablet Take 1,000 Units daily by mouth.   fluticasone (FLONASE) 50 MCG/ACT nasal spray Place 2 sprays into both nostrils daily as needed for allergies.    Multiple Vitamin (MULTIVITAMIN) tablet Take 1 tablet daily by mouth.   Omega-3 Fatty Acids (FISH OIL OMEGA-3) 1000 MG CAPS Take 1,000 mg by mouth daily.    vitamin B-12 (CYANOCOBALAMIN) 1000 MCG tablet Take 1,000 mcg by mouth daily.   No  facility-administered encounter medications on file as of 05/02/2023.     There were no vitals filed for this visit. There is no height or weight on file to calculate BMI.   PHYSICAL EXAM GENERAL:alert, no distress and comfortable SKIN: no rash  EYES: sclera clear NECK: without mass LYMPH:  no palpable cervical or supraclavicular lymphadenopathy  LUNGS: clear with normal breathing effort HEART: regular rate & rhythm, no lower extremity edema ABDOMEN: abdomen soft, non-tender and normal bowel sounds NEURO: alert & oriented x 3 with fluent speech, no focal motor/sensory deficits Breast exam:  PAC without erythema  CBC    Component Value Date/Time   WBC 3.8 (L) 11/01/2022 1025   WBC 4.5 10/03/2017 0021   RBC 4.58 11/01/2022 1025   HGB 13.8 11/01/2022 1025   HCT 40.0 11/01/2022 1025   PLT 158 11/01/2022 1025   MCV 87.3 11/01/2022 1025   MCH 30.1 11/01/2022 1025   MCHC 34.5 11/01/2022 1025   RDW 12.8 11/01/2022 1025   LYMPHSABS 0.9 11/01/2022 1025   MONOABS 0.3 11/01/2022 1025   EOSABS 0.2 11/01/2022 1025   BASOSABS 0.0 11/01/2022 1025     CMP     Component Value Date/Time   NA 142 11/01/2022 1025   K 4.5 11/01/2022 1025   CL 108 11/01/2022 1025   CO2 29 11/01/2022 1025   GLUCOSE 96 11/01/2022 1025   BUN 16 11/01/2022 1025   CREATININE 0.74 11/01/2022 1025   CALCIUM 9.9 11/01/2022 1025   PROT 6.3 (L) 11/01/2022 1025   ALBUMIN 4.3 11/01/2022 1025   AST 23 11/01/2022 1025   ALT 15 11/01/2022 1025   ALKPHOS 51 11/01/2022 1025   BILITOT 0.7 11/01/2022 1025   GFRNONAA >60 11/01/2022 1025   GFRAA >60 08/10/2020 1544     ASSESSMENT & PLAN:Timmy Page Scarfo is a 77 y.o. female with      1. 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 S/p right breast lumpectomy and SLNB by Dr Dwain Sarna on 08/21/20. Her Oncotype showed low risk with RS 10. She completed adjuvant radiation  with Dr Basilio Cairo  -She started anastrozole in 11/2020. She stopped after 1 month due to joint, bone pain, sadness, and other side effects.  She declined tamoxifen or any other AI given concern for possible side effects  -Mammogram 06/2022 was benign.     2.  Abnormal TSH, health maintenance -TSH 5 -8 in the past -Per PCP   3. Bone health -She may have had osteoporosis in the past, Zometa was discussed but she never received it by our records -Jaw pain f/up per dentist  -Repeat DEXAs per PCP  PLAN:  No orders of the defined types were placed in this encounter.     All questions were answered. The patient knows to call the clinic with any problems, questions or concerns. No barriers to learning were detected. I spent *** counseling the patient face to face. The total time spent in the appointment was *** and more than 50% was on counseling, review of test results, and coordination of care.   Santiago Glad, NP-C @DATE @

## 2023-05-01 ENCOUNTER — Other Ambulatory Visit: Payer: Self-pay

## 2023-05-01 DIAGNOSIS — C50511 Malignant neoplasm of lower-outer quadrant of right female breast: Secondary | ICD-10-CM

## 2023-05-02 ENCOUNTER — Inpatient Hospital Stay: Payer: Medicare HMO | Attending: Hematology

## 2023-05-02 ENCOUNTER — Other Ambulatory Visit: Payer: Self-pay

## 2023-05-02 ENCOUNTER — Encounter: Payer: Self-pay | Admitting: Nurse Practitioner

## 2023-05-02 ENCOUNTER — Inpatient Hospital Stay (HOSPITAL_BASED_OUTPATIENT_CLINIC_OR_DEPARTMENT_OTHER): Payer: Medicare HMO | Admitting: Nurse Practitioner

## 2023-05-02 VITALS — BP 157/67 | HR 82 | Temp 97.4°F | Resp 17 | Ht 62.0 in | Wt 124.5 lb

## 2023-05-02 DIAGNOSIS — Z923 Personal history of irradiation: Secondary | ICD-10-CM | POA: Diagnosis not present

## 2023-05-02 DIAGNOSIS — R6884 Jaw pain: Secondary | ICD-10-CM | POA: Diagnosis not present

## 2023-05-02 DIAGNOSIS — Z170 Estrogen receptor positive status [ER+]: Secondary | ICD-10-CM

## 2023-05-02 DIAGNOSIS — Z853 Personal history of malignant neoplasm of breast: Secondary | ICD-10-CM | POA: Diagnosis not present

## 2023-05-02 DIAGNOSIS — C50511 Malignant neoplasm of lower-outer quadrant of right female breast: Secondary | ICD-10-CM | POA: Diagnosis not present

## 2023-05-02 DIAGNOSIS — R946 Abnormal results of thyroid function studies: Secondary | ICD-10-CM | POA: Diagnosis not present

## 2023-05-02 LAB — CMP (CANCER CENTER ONLY)
ALT: 13 U/L (ref 0–44)
AST: 23 U/L (ref 15–41)
Albumin: 4.5 g/dL (ref 3.5–5.0)
Alkaline Phosphatase: 68 U/L (ref 38–126)
Anion gap: 7 (ref 5–15)
BUN: 14 mg/dL (ref 8–23)
CO2: 26 mmol/L (ref 22–32)
Calcium: 9.7 mg/dL (ref 8.9–10.3)
Chloride: 107 mmol/L (ref 98–111)
Creatinine: 0.78 mg/dL (ref 0.44–1.00)
GFR, Estimated: >60 mL/min (ref >60)
Glucose, Bld: 100 mg/dL — ABNORMAL HIGH (ref 70–99)
Potassium: 4.5 mmol/L (ref 3.5–5.1)
Sodium: 140 mmol/L (ref 135–145)
Total Bilirubin: 0.9 mg/dL (ref 0.3–1.2)
Total Protein: 7.2 g/dL (ref 6.5–8.1)

## 2023-05-02 LAB — CBC WITH DIFFERENTIAL (CANCER CENTER ONLY)
Abs Immature Granulocytes: 0.02 10*3/uL (ref 0.00–0.07)
Basophils Absolute: 0 10*3/uL (ref 0.0–0.1)
Basophils Relative: 1 %
Eosinophils Absolute: 0.2 10*3/uL (ref 0.0–0.5)
Eosinophils Relative: 4 %
HCT: 40.8 % (ref 36.0–46.0)
Hemoglobin: 13.4 g/dL (ref 12.0–15.0)
Immature Granulocytes: 0 %
Lymphocytes Relative: 15 %
Lymphs Abs: 0.8 10*3/uL (ref 0.7–4.0)
MCH: 28.3 pg (ref 26.0–34.0)
MCHC: 32.8 g/dL (ref 30.0–36.0)
MCV: 86.1 fL (ref 80.0–100.0)
Monocytes Absolute: 0.4 10*3/uL (ref 0.1–1.0)
Monocytes Relative: 6 %
Neutro Abs: 4.2 10*3/uL (ref 1.7–7.7)
Neutrophils Relative %: 74 %
Platelet Count: 180 10*3/uL (ref 150–400)
RBC: 4.74 MIL/uL (ref 3.87–5.11)
RDW: 12.5 % (ref 11.5–15.5)
WBC Count: 5.7 10*3/uL (ref 4.0–10.5)
nRBC: 0 % (ref 0.0–0.2)

## 2023-05-03 ENCOUNTER — Telehealth: Payer: Self-pay | Admitting: *Deleted

## 2023-05-03 NOTE — Telephone Encounter (Signed)
Received referral for Ms. Jean Schwartz to continue breast cancer surveillance in [place].  She has history of breast cancer in 2021.  She stated she does not need to be seen for 1 year since she just saw NP at [facility].   She would like to see Dr. Smith Robert.

## 2023-05-04 DIAGNOSIS — M810 Age-related osteoporosis without current pathological fracture: Secondary | ICD-10-CM | POA: Diagnosis not present

## 2023-05-18 DIAGNOSIS — D2261 Melanocytic nevi of right upper limb, including shoulder: Secondary | ICD-10-CM | POA: Diagnosis not present

## 2023-05-18 DIAGNOSIS — D225 Melanocytic nevi of trunk: Secondary | ICD-10-CM | POA: Diagnosis not present

## 2023-05-18 DIAGNOSIS — L538 Other specified erythematous conditions: Secondary | ICD-10-CM | POA: Diagnosis not present

## 2023-05-18 DIAGNOSIS — D2271 Melanocytic nevi of right lower limb, including hip: Secondary | ICD-10-CM | POA: Diagnosis not present

## 2023-05-18 DIAGNOSIS — L820 Inflamed seborrheic keratosis: Secondary | ICD-10-CM | POA: Diagnosis not present

## 2023-05-18 DIAGNOSIS — D2262 Melanocytic nevi of left upper limb, including shoulder: Secondary | ICD-10-CM | POA: Diagnosis not present

## 2023-05-22 DIAGNOSIS — N812 Incomplete uterovaginal prolapse: Secondary | ICD-10-CM | POA: Diagnosis not present

## 2023-05-22 DIAGNOSIS — N3281 Overactive bladder: Secondary | ICD-10-CM | POA: Diagnosis not present

## 2023-06-07 DIAGNOSIS — M810 Age-related osteoporosis without current pathological fracture: Secondary | ICD-10-CM | POA: Diagnosis not present

## 2023-07-06 DIAGNOSIS — N813 Complete uterovaginal prolapse: Secondary | ICD-10-CM | POA: Diagnosis not present

## 2023-07-06 DIAGNOSIS — Z01818 Encounter for other preprocedural examination: Secondary | ICD-10-CM | POA: Diagnosis not present

## 2023-07-19 ENCOUNTER — Ambulatory Visit
Admission: RE | Admit: 2023-07-19 | Discharge: 2023-07-19 | Disposition: A | Discharge status: Home / Self Care | Payer: Medicare HMO | Source: Ambulatory Visit | Attending: Internal Medicine | Admitting: Internal Medicine

## 2023-07-19 DIAGNOSIS — Z1231 Encounter for screening mammogram for malignant neoplasm of breast: Secondary | ICD-10-CM | POA: Insufficient documentation

## 2023-07-21 ENCOUNTER — Other Ambulatory Visit: Payer: Self-pay | Admitting: Internal Medicine

## 2023-07-21 DIAGNOSIS — R928 Other abnormal and inconclusive findings on diagnostic imaging of breast: Secondary | ICD-10-CM

## 2023-07-28 ENCOUNTER — Ambulatory Visit
Admission: RE | Admit: 2023-07-28 | Discharge: 2023-07-28 | Disposition: A | Discharge status: Home / Self Care | Payer: Medicare HMO | Source: Ambulatory Visit | Attending: Internal Medicine | Admitting: Internal Medicine

## 2023-07-28 DIAGNOSIS — R928 Other abnormal and inconclusive findings on diagnostic imaging of breast: Secondary | ICD-10-CM

## 2023-07-28 DIAGNOSIS — R92333 Mammographic heterogeneous density, bilateral breasts: Secondary | ICD-10-CM | POA: Diagnosis not present

## 2023-08-11 DIAGNOSIS — Z853 Personal history of malignant neoplasm of breast: Secondary | ICD-10-CM | POA: Diagnosis not present

## 2023-08-11 DIAGNOSIS — I10 Essential (primary) hypertension: Secondary | ICD-10-CM | POA: Diagnosis not present

## 2023-08-11 DIAGNOSIS — Z9851 Tubal ligation status: Secondary | ICD-10-CM | POA: Diagnosis not present

## 2023-08-11 DIAGNOSIS — N888 Other specified noninflammatory disorders of cervix uteri: Secondary | ICD-10-CM | POA: Diagnosis not present

## 2023-08-11 DIAGNOSIS — N812 Incomplete uterovaginal prolapse: Secondary | ICD-10-CM | POA: Diagnosis not present

## 2023-08-11 DIAGNOSIS — N813 Complete uterovaginal prolapse: Secondary | ICD-10-CM | POA: Diagnosis not present

## 2023-08-11 DIAGNOSIS — Z79899 Other long term (current) drug therapy: Secondary | ICD-10-CM | POA: Diagnosis not present

## 2023-08-11 DIAGNOSIS — N814 Uterovaginal prolapse, unspecified: Secondary | ICD-10-CM | POA: Diagnosis not present

## 2023-09-25 DIAGNOSIS — Z79899 Other long term (current) drug therapy: Secondary | ICD-10-CM | POA: Diagnosis not present

## 2023-09-25 DIAGNOSIS — I10 Essential (primary) hypertension: Secondary | ICD-10-CM | POA: Diagnosis not present

## 2023-09-25 DIAGNOSIS — E7800 Pure hypercholesterolemia, unspecified: Secondary | ICD-10-CM | POA: Diagnosis not present

## 2023-09-25 DIAGNOSIS — J984 Other disorders of lung: Secondary | ICD-10-CM | POA: Diagnosis not present

## 2023-09-25 DIAGNOSIS — M810 Age-related osteoporosis without current pathological fracture: Secondary | ICD-10-CM | POA: Diagnosis not present

## 2023-10-03 ENCOUNTER — Other Ambulatory Visit: Payer: Self-pay | Admitting: Internal Medicine

## 2023-10-03 DIAGNOSIS — R918 Other nonspecific abnormal finding of lung field: Secondary | ICD-10-CM

## 2023-10-04 ENCOUNTER — Inpatient Hospital Stay
Admission: RE | Admit: 2023-10-04 | Discharge: 2023-10-04 | Discharge status: Home / Self Care | Payer: Medicare HMO | Source: Ambulatory Visit | Attending: Internal Medicine | Admitting: Internal Medicine

## 2023-10-04 DIAGNOSIS — I700 Atherosclerosis of aorta: Secondary | ICD-10-CM | POA: Diagnosis not present

## 2023-10-04 DIAGNOSIS — R918 Other nonspecific abnormal finding of lung field: Secondary | ICD-10-CM

## 2023-10-04 DIAGNOSIS — R911 Solitary pulmonary nodule: Secondary | ICD-10-CM | POA: Diagnosis not present

## 2023-10-17 DIAGNOSIS — Z0100 Encounter for examination of eyes and vision without abnormal findings: Secondary | ICD-10-CM | POA: Diagnosis not present

## 2023-12-27 DIAGNOSIS — E7800 Pure hypercholesterolemia, unspecified: Secondary | ICD-10-CM | POA: Diagnosis not present

## 2023-12-28 ENCOUNTER — Other Ambulatory Visit: Payer: Self-pay | Admitting: Internal Medicine

## 2023-12-28 DIAGNOSIS — R911 Solitary pulmonary nodule: Secondary | ICD-10-CM

## 2024-01-03 ENCOUNTER — Ambulatory Visit
Admission: RE | Admit: 2024-01-03 | Discharge: 2024-01-03 | Disposition: A | Discharge status: Home / Self Care | Payer: HMO | Source: Ambulatory Visit | Attending: Internal Medicine | Admitting: Internal Medicine

## 2024-01-03 DIAGNOSIS — R911 Solitary pulmonary nodule: Secondary | ICD-10-CM | POA: Insufficient documentation

## 2024-01-03 DIAGNOSIS — C50919 Malignant neoplasm of unspecified site of unspecified female breast: Secondary | ICD-10-CM | POA: Diagnosis not present

## 2024-03-27 DIAGNOSIS — Z1231 Encounter for screening mammogram for malignant neoplasm of breast: Secondary | ICD-10-CM | POA: Diagnosis not present

## 2024-03-27 DIAGNOSIS — Z79899 Other long term (current) drug therapy: Secondary | ICD-10-CM | POA: Diagnosis not present

## 2024-03-27 DIAGNOSIS — Z853 Personal history of malignant neoplasm of breast: Secondary | ICD-10-CM | POA: Diagnosis not present

## 2024-03-27 DIAGNOSIS — I10 Essential (primary) hypertension: Secondary | ICD-10-CM | POA: Diagnosis not present

## 2024-03-27 DIAGNOSIS — E7800 Pure hypercholesterolemia, unspecified: Secondary | ICD-10-CM | POA: Diagnosis not present

## 2024-03-27 DIAGNOSIS — Z0000 Encounter for general adult medical examination without abnormal findings: Secondary | ICD-10-CM | POA: Diagnosis not present

## 2024-03-27 DIAGNOSIS — M810 Age-related osteoporosis without current pathological fracture: Secondary | ICD-10-CM | POA: Diagnosis not present

## 2024-03-28 ENCOUNTER — Other Ambulatory Visit: Payer: Self-pay | Admitting: Internal Medicine

## 2024-03-28 DIAGNOSIS — Z1231 Encounter for screening mammogram for malignant neoplasm of breast: Secondary | ICD-10-CM

## 2024-05-06 ENCOUNTER — Encounter: Payer: Self-pay | Admitting: Oncology

## 2024-05-06 ENCOUNTER — Inpatient Hospital Stay: Payer: Medicare HMO | Attending: Oncology | Admitting: Oncology

## 2024-05-06 VITALS — BP 130/66 | HR 51 | Temp 96.4°F | Ht 62.0 in | Wt 125.4 lb

## 2024-05-06 DIAGNOSIS — M810 Age-related osteoporosis without current pathological fracture: Secondary | ICD-10-CM | POA: Diagnosis not present

## 2024-05-06 DIAGNOSIS — Z08 Encounter for follow-up examination after completed treatment for malignant neoplasm: Secondary | ICD-10-CM

## 2024-05-06 DIAGNOSIS — Z923 Personal history of irradiation: Secondary | ICD-10-CM | POA: Diagnosis not present

## 2024-05-06 DIAGNOSIS — Z808 Family history of malignant neoplasm of other organs or systems: Secondary | ICD-10-CM | POA: Diagnosis not present

## 2024-05-06 DIAGNOSIS — Z803 Family history of malignant neoplasm of breast: Secondary | ICD-10-CM | POA: Insufficient documentation

## 2024-05-06 DIAGNOSIS — Z853 Personal history of malignant neoplasm of breast: Secondary | ICD-10-CM | POA: Insufficient documentation

## 2024-05-06 NOTE — Progress Notes (Signed)
 Hematology/Oncology Consult note [facility]  [phone] [phone]  Patient Care Team: Yehuda Helms, MD as PCP - General (Internal Medicine) Auther Bo, RN as Oncology Nurse Navigator Alane Hsu, RN as Oncology Nurse Navigator Sonja Petersburg, MD as Consulting Physician (Hematology) Enid Harry, MD as Consulting Physician (General Surgery) Colie Dawes, MD as Attending Physician (Radiation Oncology) Burton, Lacie K, NP as Nurse Practitioner (Nurse Practitioner)   Name of the patient: Jean Schwartz  [id number]  12-24-1945   Date of visit: 05/06/24  Diagnosis-history of stage I right breast cancer ER/PR positive HER2 negative in 2022  Chief complaint/ Reason for visit-routine follow-up of breast cancer  Heme/Onc history:  Oncology History Overview Note  Cancer Staging Malignant neoplasm of lower-outer quadrant of right breast of female, estrogen receptor positive (HCC) Staging form: Breast, AJCC 8th Edition - Clinical stage from 07/23/2020: Stage IA (cT1b, cN0, cM0, G2, ER+, PR+, HER2-) - Signed by Sonja Pender, MD on 08/10/2020    Malignant neoplasm of lower-outer quadrant of right breast of female, estrogen receptor positive (HCC)  07/14/2020 Mammogram   Diagnostic Mammogram 07/14/20  IMPRESSION: 1.0 x 1.0 x 0.9cm mass in the 8 o'clock position of the right breast 3cmfn with imaging features suspicious for malignancy.   07/23/2020 Cancer Staging   Staging form: Breast, AJCC 8th Edition - Clinical stage from 07/23/2020: Stage IA (cT1b, cN0, cM0, G2, ER+, PR+, HER2-) - Signed by Sonja [place], MD on 08/10/2020   07/23/2020 Initial Biopsy   DIAGNOSIS: 07/23/20 A. BREAST, RIGHT 8:00 3 CM FN; ULTRASOUND-GUIDED BIOPSY:  - INVASIVE MAMMARY CARCINOMA, NO SPECIAL TYPE.    07/23/2020 Receptors her2   ADDENDUM:  BREAST BIOMARKER TESTS  Estrogen Receptor (ER) Status: Positive (greater than 10% of cells  demonstrate nuclear positivity)                        Percentage of cells with nuclear positivity:  91-100%                       Average intensity of staining: Strong   Progesterone Receptor (PgR) Status: Positive (greater than 10% of cells  demonstrate nuclear positivity)                       Percentage of cells with nuclear positivity: 51-90%                       Average intensity of staining: Moderate   HER2 (by immunohistochemistry): Negative (Score 0)    08/10/2020 Initial Diagnosis   Malignant neoplasm of lower-outer quadrant of right breast of female, estrogen receptor positive (HCC)   08/21/2020 Surgery   RIGHT BREAST LUMPECTOMY WITH RADIOACTIVE SEED AND RIGHT AXILLARY SENTINEL LYMPH NODE BIOPSY by Dr Delane Fear     08/21/2020 Pathology Results   FINAL MICROSCOPIC DIAGNOSIS:   A. 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  - Low grade ductal carcinoma in situ.  - Invasive carcinoma is <1 mm from the lateral margin broadly, see  comment.  - Margins are negative for in situ carcinoma.  - See oncology table.   B. LYMPH NODE, RIGHT AXILLA, SENTINEL, EXCISION:  - One of one lymph nodes negative for carcinoma (0/1).   C. BREAST, RIGHT ADDITIONAL MEDIAL MARGIN, EXCISION:  - Benign breast tissue.   D. BREAST, RIGHT ADDITIONAL SUPERIOR MARGIN, EXCISION:  -  Benign breast tissue.   E. BREAST, RIGHT ADDITIONAL ANTERIOR MARGIN, EXCISION:  - Benign breast tissue.   F. BREAST, RIGHT ADDITIONAL POSTERIOR MARGIN, EXCISION:  - Benign breast tissue.    08/21/2020 Oncotype testing   Oncotype  Recurrence Score 10 with distant recurrence risk at 9 years at 3% with AI or Tamoxifen alone  Less than 1% benefit of Adjuvant chemothreapy.     08/21/2020 Cancer Staging   Staging form: Breast, AJCC 8th Edition - Pathologic stage from 08/21/2020: Stage IA (pT1c, pN0, cM0, G2, ER+, PR+, HER2-) - Signed by Burton, Lacie K, NP on 02/02/2021 Nuclear grade: G2 Histologic grading system:  3 grade system   09/28/2020 - 10/27/2020 Radiation Therapy   Adjuvant Radiation with Dr Lurena Sally    11/2020 - 12/2020 Anti-estrogen oral therapy   Anastrozole  1mg  daily starting 11/2020, stopped after 1 months due to joint, bone pain, sadness, and other side effects. She is not interested in any other Antiestrogen therapy   02/03/2021 Survivorship   SCP delivered virtually by Lacie Burton, NP       Interval history-overall patient reports doing well.  Denies any breast concerns at this time.  Denies any changes in her appetite or weight.  Denies any new aches and pains anywhere  ECOG PS- 1 Pain scale- 0   Review of systems- Review of Systems  Constitutional:  Negative for chills, fever, malaise/fatigue and weight loss.  HENT:  Negative for congestion, ear discharge and nosebleeds.   Eyes:  Negative for blurred vision.  Respiratory:  Negative for cough, hemoptysis, sputum production, shortness of breath and wheezing.   Cardiovascular:  Negative for chest pain, palpitations, orthopnea and claudication.  Gastrointestinal:  Negative for abdominal pain, blood in stool, constipation, diarrhea, heartburn, melena, nausea and vomiting.  Genitourinary:  Negative for dysuria, flank pain, frequency, hematuria and urgency.  Musculoskeletal:  Negative for back pain, joint pain and myalgias.  Skin:  Negative for rash.  Neurological:  Negative for dizziness, tingling, focal weakness, seizures, weakness and headaches.  Endo/Heme/Allergies:  Does not bruise/bleed easily.  Psychiatric/Behavioral:  Negative for depression and suicidal ideas. The patient does not have insomnia.       No Known Allergies   Past Medical History:  Diagnosis Date   Allergy    Arthritis    Breast cancer (HCC)    right breast, lumpectomy and rad tx   Cataract    Complication of anesthesia    per patient, for colonoscopy it was hard to wake up, but for previous surgery no problems waking    GERD (gastroesophageal reflux  disease)    Osteoporosis    Personal history of radiation therapy    right breast ca 2021     Past Surgical History:  Procedure Laterality Date   BREAST BIOPSY Right 07/23/2020   US  Bx, Vision clip, IMC   BREAST LUMPECTOMY WITH RADIOACTIVE SEED AND SENTINEL LYMPH NODE BIOPSY Right 08/21/2020   Procedure: RIGHT BREAST LUMPECTOMY WITH RADIOACTIVE SEED AND RIGHT AXILLARY SENTINEL LYMPH NODE BIOPSY;  Surgeon: Enid Harry, MD;  Location: [facility] OR;  Service: General;  Laterality: Right;  PEC BLOCK   CATARACT EXTRACTION W/ INTRAOCULAR LENS  IMPLANT, BILATERAL Bilateral    COLONOSCOPY     TUBAL LIGATION      Social History   Socioeconomic History   Marital status: Married    Spouse name: Not on file   Number of children: 2   Years of education: Not on file   Highest education level: Not  on file  Occupational History   Occupation: retired  Tobacco Use   Smoking status: Never   Smokeless tobacco: Never  Vaping Use   Vaping status: Never Used  Substance and Sexual Activity   Alcohol use: No   Drug use: No   Sexual activity: Not Currently  Other Topics Concern   Not on file  Social History Narrative   Not on file   Social Drivers of Health   Financial Resource Strain: Low Risk  (03/27/2024)   Received from [facility]   Overall Financial Resource Strain (CARDIA)    Difficulty of Paying Living Expenses: Not hard at all  Food Insecurity: No Food Insecurity (03/27/2024)   [facility]   Hunger Vital Sign    Within the past 12 months, you worried that your food would run out before you got the money to buy more.: Never true    Within the past 12 months, the food you bought just didn't last and you didn't have money to get more.: Never true  Transportation Needs: No Transportation Needs (03/27/2024)   Received from [facility] - Transportation    In the past 12 months, has lack of transportation kept you  from medical appointments or from getting medications?: No    Lack of Transportation (Non-Medical): No  Physical Activity: Not on file  Stress: Not on file  Social Connections: Not on file  Intimate Partner Violence: Not on file    Family History  Problem Relation Age of Onset   Cancer Mother        skin cancer   Cancer Niece 26       breast cancer    Breast cancer Neg Hx    Colon cancer Neg Hx    Esophageal cancer Neg Hx    Rectal cancer Neg Hx    Stomach cancer Neg Hx      Current Outpatient Medications:    amLODipine (NORVASC) 5 MG tablet, Take 1 tablet by mouth daily., Disp: , Rfl:    Biotin w/ Vitamins C & E (HAIR/SKIN/NAILS PO), Take 1 tablet by mouth daily., Disp: , Rfl:    Calcium Citrate (CITRACAL PO), Take 1 tablet by mouth daily., Disp: , Rfl:    cholecalciferol (VITAMIN D) 1000 units tablet, Take 1,000 Units daily by mouth., Disp: , Rfl:    fluticasone (FLONASE) 50 MCG/ACT nasal spray, Place 2 sprays into both nostrils daily as needed for allergies. , Disp: , Rfl:    Multiple Vitamin (MULTIVITAMIN) tablet, Take 1 tablet daily by mouth., Disp: , Rfl:    Omega-3 Fatty Acids (FISH OIL OMEGA-3) 1000 MG CAPS, Take 1,000 mg by mouth daily. , Disp: , Rfl:    vitamin B-12 (CYANOCOBALAMIN) 1000 MCG tablet, Take 1,000 mcg by mouth daily., Disp: , Rfl:    APPLE CIDER VINEGAR PO, Take 2 tablets by mouth daily. (Patient not taking: Reported on 05/06/2024), Disp: , Rfl:   Physical exam:  Vitals:   05/06/24 1031  BP: 130/66  Pulse: (!) 51  Temp: (!) 96.4 F (35.8 C)  TempSrc: Tympanic  SpO2: 100%  Weight: 125 lb 6.4 oz (56.9 kg)  Height: 5' 2 (1.575 m)   Physical Exam  Cardiovascular:     Rate and Rhythm: Normal rate and regular rhythm.     Heart sounds: Normal heart sounds.  Pulmonary:     Effort: Pulmonary effort is normal.     Breath sounds: Normal  breath sounds.  Abdominal:     General: Bowel sounds are normal.   Skin:    General: Skin is warm and dry.    Neurological:     Mental Status: She is alert and oriented to person, place, and time.    Breast exam was performed in seated and lying down position. Patient is status post right lumpectomy with a well-healed surgical scar. No evidence of any palpable masses. No evidence of axillary adenopathy. No evidence of any palpable masses or lumps in the left breast. No evidence of leftt axillary adenopathy   I have personally reviewed labs listed below:    Latest Ref Rng & Units 05/02/2023   10:26 AM  CMP  Glucose 70 - 99 mg/dL 161   BUN 8 - 23 mg/dL 14   Creatinine 0.96 - 1.00 mg/dL 0.45   Sodium 409 - 811 mmol/L 140   Potassium 3.5 - 5.1 mmol/L 4.5   Chloride 98 - 111 mmol/L 107   CO2 22 - 32 mmol/L 26   Calcium 8.9 - 10.3 mg/dL 9.7   Total Protein 6.5 - 8.1 g/dL 7.2   Total Bilirubin 0.3 - 1.2 mg/dL 0.9   Alkaline Phos 38 - 126 U/L 68   AST 15 - 41 U/L 23   ALT 0 - 44 U/L 13       Latest Ref Rng & Units 05/02/2023   10:26 AM  CBC  WBC 4.0 - 10.5 K/uL 5.7   Hemoglobin 12.0 - 15.0 g/dL 91.4   Hematocrit 78.2 - 46.0 % 40.8   Platelets 150 - 400 K/uL 180       Assessment and plan- Patient is a 78 y.o. female with history of pathological prognostic stage Ia invasive mammary carcinoma of the right breast ER/PR positive HER2 negative status postlumpectomy and adjuvant radiation therapy here for routine follow-up  Patient was following up with [facility] clean up until last year but wishes to follow closer to home and therefore transferring care to me.  Patient briefly took endocrine therapy with anastrozole  1 month after her diagnosis but stopped taking it since because of joint pain.  She has remained off endocrine therapy since then.  Clinically she is doing well with no concerning signs and symptoms of recurrence based on today's exam.  Mammogram from August 2024 was unremarkable and yearly mammograms are being coordinated by Dr. Claudius Cumins.  No role for tumor marker testing or routine  labs for breast cancer surveillance.  I will see her on a yearly basis.  Patient has a history of osteoporosis and her last bone density scan was from May 2024 which showed a T-score of -2.5 in the left femur neck.  She will follow-up with Dr. Claudius Cumins for the same.  She has seen Dr. Lorelei Rogers from endocrinology as well.   Visit Diagnosis 1. Encounter for follow-up surveillance of breast cancer      Dr. Seretha Dance, MD, MPH [facility] at [facility] [phone] 05/06/2024 12:45 PM

## 2024-05-21 DIAGNOSIS — L821 Other seborrheic keratosis: Secondary | ICD-10-CM | POA: Diagnosis not present

## 2024-05-21 DIAGNOSIS — D225 Melanocytic nevi of trunk: Secondary | ICD-10-CM | POA: Diagnosis not present

## 2024-05-21 DIAGNOSIS — D2272 Melanocytic nevi of left lower limb, including hip: Secondary | ICD-10-CM | POA: Diagnosis not present

## 2024-05-21 DIAGNOSIS — D2271 Melanocytic nevi of right lower limb, including hip: Secondary | ICD-10-CM | POA: Diagnosis not present

## 2024-05-21 DIAGNOSIS — D2261 Melanocytic nevi of right upper limb, including shoulder: Secondary | ICD-10-CM | POA: Diagnosis not present

## 2024-05-21 DIAGNOSIS — D2262 Melanocytic nevi of left upper limb, including shoulder: Secondary | ICD-10-CM | POA: Diagnosis not present

## 2024-05-27 DIAGNOSIS — Z961 Presence of intraocular lens: Secondary | ICD-10-CM | POA: Diagnosis not present

## 2024-07-03 DIAGNOSIS — E7800 Pure hypercholesterolemia, unspecified: Secondary | ICD-10-CM | POA: Diagnosis not present

## 2024-07-03 DIAGNOSIS — R946 Abnormal results of thyroid function studies: Secondary | ICD-10-CM | POA: Diagnosis not present

## 2024-08-12 ENCOUNTER — Ambulatory Visit
Admission: RE | Admit: 2024-08-12 | Discharge: 2024-08-12 | Disposition: A | Discharge status: Home / Self Care | Source: Ambulatory Visit | Attending: Internal Medicine | Admitting: Internal Medicine

## 2024-08-12 DIAGNOSIS — Z1231 Encounter for screening mammogram for malignant neoplasm of breast: Secondary | ICD-10-CM | POA: Diagnosis not present

## 2024-08-19 DIAGNOSIS — H02403 Unspecified ptosis of bilateral eyelids: Secondary | ICD-10-CM | POA: Diagnosis not present

## 2024-10-02 DIAGNOSIS — N3281 Overactive bladder: Secondary | ICD-10-CM | POA: Diagnosis not present

## 2024-10-02 DIAGNOSIS — I10 Essential (primary) hypertension: Secondary | ICD-10-CM | POA: Diagnosis not present

## 2024-10-02 DIAGNOSIS — M810 Age-related osteoporosis without current pathological fracture: Secondary | ICD-10-CM | POA: Diagnosis not present

## 2024-10-02 DIAGNOSIS — E782 Mixed hyperlipidemia: Secondary | ICD-10-CM | POA: Diagnosis not present

## 2024-10-02 DIAGNOSIS — Z1211 Encounter for screening for malignant neoplasm of colon: Secondary | ICD-10-CM | POA: Diagnosis not present

## 2024-10-02 DIAGNOSIS — Z79899 Other long term (current) drug therapy: Secondary | ICD-10-CM | POA: Diagnosis not present

## 2025-05-05 ENCOUNTER — Ambulatory Visit: Admitting: Oncology

## 2025-05-06 ENCOUNTER — Ambulatory Visit: Admitting: Oncology
